# Patient Record
Sex: Female | Born: 1950 | Race: Black or African American | Hispanic: No | Smoking: Never smoker
Health system: Southern US, Community
[De-identification: ages and names within clinical notes are randomized; demographics above are authoritative.]

## PROBLEM LIST (undated history)

## (undated) DIAGNOSIS — C801 Malignant (primary) neoplasm, unspecified: Secondary | ICD-10-CM

## (undated) DIAGNOSIS — J45909 Unspecified asthma, uncomplicated: Secondary | ICD-10-CM

## (undated) DIAGNOSIS — K219 Gastro-esophageal reflux disease without esophagitis: Secondary | ICD-10-CM

## (undated) DIAGNOSIS — M419 Scoliosis, unspecified: Secondary | ICD-10-CM

## (undated) HISTORY — PX: REDUCTION MAMMAPLASTY: SUR839

---

## 1991-12-04 HISTORY — PX: ABDOMINAL HYSTERECTOMY: SHX81

## 1998-07-08 ENCOUNTER — Other Ambulatory Visit: Admission: RE | Admit: 1998-07-08 | Discharge: 1998-07-08 | Payer: Self-pay | Admitting: Gynecology

## 1999-07-18 ENCOUNTER — Other Ambulatory Visit: Admission: RE | Admit: 1999-07-18 | Discharge: 1999-07-18 | Payer: Self-pay | Admitting: Gynecology

## 2000-07-10 ENCOUNTER — Other Ambulatory Visit: Admission: RE | Admit: 2000-07-10 | Discharge: 2000-07-10 | Payer: Self-pay | Admitting: Gynecology

## 2001-07-15 ENCOUNTER — Other Ambulatory Visit: Admission: RE | Admit: 2001-07-15 | Discharge: 2001-07-15 | Payer: Self-pay | Admitting: Gynecology

## 2002-09-03 ENCOUNTER — Other Ambulatory Visit: Admission: RE | Admit: 2002-09-03 | Discharge: 2002-09-03 | Payer: Self-pay | Admitting: Gynecology

## 2003-09-23 ENCOUNTER — Other Ambulatory Visit: Admission: RE | Admit: 2003-09-23 | Discharge: 2003-09-23 | Payer: Self-pay | Admitting: Gynecology

## 2004-09-29 ENCOUNTER — Other Ambulatory Visit: Admission: RE | Admit: 2004-09-29 | Discharge: 2004-09-29 | Payer: Self-pay | Admitting: Gynecology

## 2005-10-02 ENCOUNTER — Other Ambulatory Visit: Admission: RE | Admit: 2005-10-02 | Discharge: 2005-10-02 | Payer: Self-pay | Admitting: Gynecology

## 2006-10-04 ENCOUNTER — Other Ambulatory Visit: Admission: RE | Admit: 2006-10-04 | Discharge: 2006-10-04 | Payer: Self-pay | Admitting: Gynecology

## 2007-10-23 ENCOUNTER — Other Ambulatory Visit: Admission: RE | Admit: 2007-10-23 | Discharge: 2007-10-23 | Payer: Self-pay | Admitting: Gynecology

## 2016-04-09 ENCOUNTER — Other Ambulatory Visit: Payer: Self-pay | Admitting: Gynecology

## 2016-04-09 DIAGNOSIS — R928 Other abnormal and inconclusive findings on diagnostic imaging of breast: Secondary | ICD-10-CM

## 2016-05-01 ENCOUNTER — Other Ambulatory Visit: Payer: Self-pay | Admitting: Gynecology

## 2016-05-01 ENCOUNTER — Ambulatory Visit
Admission: RE | Admit: 2016-05-01 | Discharge: 2016-05-01 | Disposition: A | Discharge status: Home / Self Care | Payer: BLUE CROSS/BLUE SHIELD | Source: Ambulatory Visit | Attending: Gynecology | Admitting: Gynecology

## 2016-05-01 DIAGNOSIS — R928 Other abnormal and inconclusive findings on diagnostic imaging of breast: Secondary | ICD-10-CM

## 2016-05-01 DIAGNOSIS — R921 Mammographic calcification found on diagnostic imaging of breast: Secondary | ICD-10-CM

## 2016-05-10 ENCOUNTER — Ambulatory Visit
Admission: RE | Admit: 2016-05-10 | Discharge: 2016-05-10 | Disposition: A | Discharge status: Home / Self Care | Payer: BLUE CROSS/BLUE SHIELD | Source: Ambulatory Visit | Attending: Gynecology | Admitting: Gynecology

## 2016-05-10 DIAGNOSIS — R921 Mammographic calcification found on diagnostic imaging of breast: Secondary | ICD-10-CM

## 2016-05-10 HISTORY — PX: BREAST BIOPSY: SHX20

## 2016-06-14 ENCOUNTER — Other Ambulatory Visit: Payer: Self-pay | Admitting: General Surgery

## 2016-06-14 DIAGNOSIS — C50911 Malignant neoplasm of unspecified site of right female breast: Secondary | ICD-10-CM

## 2016-06-19 ENCOUNTER — Ambulatory Visit
Admission: RE | Admit: 2016-06-19 | Discharge: 2016-06-19 | Disposition: A | Discharge status: Home / Self Care | Payer: BLUE CROSS/BLUE SHIELD | Source: Ambulatory Visit | Attending: General Surgery | Admitting: General Surgery

## 2016-06-19 DIAGNOSIS — C50911 Malignant neoplasm of unspecified site of right female breast: Secondary | ICD-10-CM

## 2016-06-19 MED ORDER — GADOBENATE DIMEGLUMINE 529 MG/ML IV SOLN
12.0000 mL | Freq: Once | INTRAVENOUS | Status: AC | PRN
  Administered 2016-06-19: 12 mL via INTRAVENOUS

## 2016-07-02 ENCOUNTER — Other Ambulatory Visit: Payer: Self-pay | Admitting: General Surgery

## 2016-07-02 DIAGNOSIS — D0511 Intraductal carcinoma in situ of right breast: Secondary | ICD-10-CM

## 2016-07-12 NOTE — H&P (Signed)
  Subjective:    Patient ID: Hayley Marks is a 65 y.o. female.  HPI  Referred by Dr. Donne Hazel for consultation for breast reconstruction. Presented following screening MMG with right breast calcifications 04/2016. Diagnostic MMG with calcifications LIQ right 3.8 x 3.3 x 2.6 cm. Biopsy with high grade DCIS, ER/PR+. MRI demonstrated enhancement over both LIQ, LOQ spanning 6.4 x 5.5 x 3.0 cm. Given extent of suspected disease mastectomy has been recommended. Has discussed NSM. Accompanied by boyfriend.  Reports doing a lot of reading and prefers no implants as part of reconstruction.   Current 36 C, happy with this. Wt stable.  Patient lives in [place], retired Therapist, sports.   Review of Systems  Allergic/Immunologic:       Reports allergy to UV light  Remainder 12 point review negative     Objective:   Physical Exam  Cardiovascular: Normal rate, regular rhythm and normal heart sounds.   Pulmonary/Chest: Effort normal and breath sounds normal.  Abdominal: Soft.  Umbilical scar, no hernia Redundant tissue abdomen  Musculoskeletal:  +scoliosis  Lymphadenopathy:    She has no axillary adenopathy.  Skin:  Fitzpatrick 5   No masses,    SN to nipple R 21 L 22   Assessment:     DCIS right breast    Plan:      Discussed immediate vs delayed, autologous vs implant based reconstruction. Reviewed incisions, drains, OR length, hospital stay and recovery for each. Discussed process of expansion and implant based risks including rupture, MRI surveillance for silicone implants, infection requiring surgery or removal, contracture. Discussed asymmetry one can expect with implant unilateral and natural breast on opposite. Discussed TRAM vs DIEP, latter would require treatment at tertiary center. Discussed abdominal based complications including failure flap, abdominal bulge or hernia. Discussed future surgery dependent on adjuvant treatments.  Reviewed SSM vs NSM, latter will be asensate and  not stimulate. Reviewed with both risks mastectomy flap necrosis requiring additional surgery.  I have counseled patient that if she desires NSM and abdominal based reconstruction, would prefer placement expander at time of mastectomy and not immediate flap. Explained that with preservation of soft tissue envelope with NSM, would not need abdominal skin and majority of this would be discarded. If any necrosis of mastectomy flaps, we would then have lost abdominal skin that could replace this. Discussed that she could elect for expander at time of mastectomy but could also choose to go to tertiary center for DIEP or other types of free tissue transfer (she mentioned buttock tissue) in future and still have benefit of preservation soft tissue envelope from NSM.  Reviewed portfolio, examples of expanders and implants.  Plan NSM with tissue expander placement.   Irene Limbo, MD [facility] Surgery [phone], pin [phone]

## 2016-07-27 ENCOUNTER — Encounter (HOSPITAL_BASED_OUTPATIENT_CLINIC_OR_DEPARTMENT_OTHER): Payer: Self-pay | Admitting: *Deleted

## 2016-08-02 ENCOUNTER — Ambulatory Visit (HOSPITAL_BASED_OUTPATIENT_CLINIC_OR_DEPARTMENT_OTHER)
Admission: RE | Admit: 2016-08-02 | Discharge: 2016-08-03 | Disposition: A | Discharge status: Home / Self Care | Payer: BLUE CROSS/BLUE SHIELD | Source: Ambulatory Visit | Attending: General Surgery | Admitting: General Surgery

## 2016-08-02 ENCOUNTER — Ambulatory Visit (HOSPITAL_BASED_OUTPATIENT_CLINIC_OR_DEPARTMENT_OTHER): Payer: BLUE CROSS/BLUE SHIELD | Admitting: Certified Registered"

## 2016-08-02 ENCOUNTER — Encounter (HOSPITAL_BASED_OUTPATIENT_CLINIC_OR_DEPARTMENT_OTHER)
Admission: RE | Disposition: A | Discharge status: Home / Self Care | Payer: Self-pay | Source: Ambulatory Visit | Attending: General Surgery

## 2016-08-02 ENCOUNTER — Encounter (HOSPITAL_BASED_OUTPATIENT_CLINIC_OR_DEPARTMENT_OTHER): Payer: Self-pay | Admitting: Certified Registered"

## 2016-08-02 ENCOUNTER — Encounter (HOSPITAL_COMMUNITY)
Admission: RE | Admit: 2016-08-02 | Discharge: 2016-08-02 | Disposition: A | Discharge status: Home / Self Care | Payer: BLUE CROSS/BLUE SHIELD | Source: Ambulatory Visit | Attending: General Surgery | Admitting: General Surgery

## 2016-08-02 DIAGNOSIS — C50311 Malignant neoplasm of lower-inner quadrant of right female breast: Secondary | ICD-10-CM | POA: Diagnosis present

## 2016-08-02 DIAGNOSIS — Z90710 Acquired absence of both cervix and uterus: Secondary | ICD-10-CM | POA: Insufficient documentation

## 2016-08-02 DIAGNOSIS — K219 Gastro-esophageal reflux disease without esophagitis: Secondary | ICD-10-CM | POA: Diagnosis not present

## 2016-08-02 DIAGNOSIS — Z803 Family history of malignant neoplasm of breast: Secondary | ICD-10-CM | POA: Diagnosis not present

## 2016-08-02 DIAGNOSIS — Z7982 Long term (current) use of aspirin: Secondary | ICD-10-CM | POA: Diagnosis not present

## 2016-08-02 DIAGNOSIS — D0511 Intraductal carcinoma in situ of right breast: Secondary | ICD-10-CM

## 2016-08-02 HISTORY — PX: NIPPLE SPARING MASTECTOMY/SENTINAL LYMPH NODE BIOPSY/RECONSTRUCTION/PLACEMENT OF TISSUE EXPANDER: SHX6484

## 2016-08-02 HISTORY — DX: Gastro-esophageal reflux disease without esophagitis: K21.9

## 2016-08-02 HISTORY — DX: Unspecified asthma, uncomplicated: J45.909

## 2016-08-02 HISTORY — DX: Scoliosis, unspecified: M41.9

## 2016-08-02 HISTORY — DX: Malignant (primary) neoplasm, unspecified: C80.1

## 2016-08-02 HISTORY — PX: MASTECTOMY: SHX3

## 2016-08-02 HISTORY — PX: BREAST RECONSTRUCTION WITH PLACEMENT OF TISSUE EXPANDER AND FLEX HD (ACELLULAR HYDRATED DERMIS): SHX6295

## 2016-08-02 SURGERY — NIPPLE SPARING MASTECTOMY WITH SENTINAL LYMPH NODE BIOPSY AND  RECONSTRUCTION WITH PLACEMENT OF TISSUE EXPANDER
Anesthesia: Regional | Site: Breast | Laterality: Right

## 2016-08-02 MED ORDER — CYCLOBENZAPRINE HCL 10 MG PO TABS
10.0000 mg | ORAL_TABLET | Freq: Three times a day (TID) | ORAL | Status: DC | PRN
Start: 2016-08-02 — End: 2016-08-03
  Administered 2016-08-02: 10 mg via ORAL
  Filled 2016-08-02: qty 1

## 2016-08-02 MED ORDER — SIMETHICONE 80 MG PO CHEW: 40.0000 mg | CHEWABLE_TABLET | Freq: Four times a day (QID) | ORAL | Status: DC | PRN

## 2016-08-02 MED ORDER — MIDAZOLAM HCL 2 MG/2ML IJ SOLN
1.0000 mg | INTRAMUSCULAR | Status: DC | PRN
  Administered 2016-08-02 (×2): 1 mg via INTRAVENOUS

## 2016-08-02 MED ORDER — CELECOXIB 200 MG PO CAPS
ORAL_CAPSULE | ORAL | Status: AC
  Filled 2016-08-02: qty 1

## 2016-08-02 MED ORDER — FENTANYL CITRATE (PF) 100 MCG/2ML IJ SOLN
50.0000 ug | INTRAMUSCULAR | Status: AC | PRN
  Administered 2016-08-02: 50 ug via INTRAVENOUS
  Administered 2016-08-02: 100 ug via INTRAVENOUS
  Administered 2016-08-02: 50 ug via INTRAVENOUS

## 2016-08-02 MED ORDER — ONDANSETRON HCL 4 MG/2ML IJ SOLN
INTRAMUSCULAR | Status: DC | PRN
  Administered 2016-08-02: 4 mg via INTRAVENOUS

## 2016-08-02 MED ORDER — ROCURONIUM BROMIDE 100 MG/10ML IV SOLN
INTRAVENOUS | Status: DC | PRN
  Administered 2016-08-02: 50 mg via INTRAVENOUS

## 2016-08-02 MED ORDER — DEXAMETHASONE SODIUM PHOSPHATE 10 MG/ML IJ SOLN
INTRAMUSCULAR | Status: AC
Start: 2016-08-02 — End: 2016-08-02
  Filled 2016-08-02: qty 1

## 2016-08-02 MED ORDER — FAMOTIDINE 20 MG PO TABS
20.0000 mg | ORAL_TABLET | Freq: Every day | ORAL | Status: DC
  Administered 2016-08-02: 20 mg via ORAL
  Filled 2016-08-02: qty 1

## 2016-08-02 MED ORDER — ONDANSETRON 4 MG PO TBDP: 4.0000 mg | ORAL_TABLET | Freq: Four times a day (QID) | ORAL | Status: DC | PRN

## 2016-08-02 MED ORDER — MIDAZOLAM HCL 2 MG/2ML IJ SOLN
INTRAMUSCULAR | Status: AC
  Filled 2016-08-02: qty 2

## 2016-08-02 MED ORDER — HYDROMORPHONE HCL 1 MG/ML IJ SOLN: 0.5000 mg | INTRAMUSCULAR | Status: DC | PRN

## 2016-08-02 MED ORDER — ACETAMINOPHEN 500 MG PO TABS
1000.0000 mg | ORAL_TABLET | ORAL | Status: AC
  Administered 2016-08-02: 1000 mg via ORAL

## 2016-08-02 MED ORDER — CEFAZOLIN SODIUM-DEXTROSE 2-4 GM/100ML-% IV SOLN
2.0000 g | INTRAVENOUS | Status: AC
  Administered 2016-08-02: 2 g via INTRAVENOUS

## 2016-08-02 MED ORDER — OXYCODONE HCL 5 MG PO TABS: 5.0000 mg | ORAL_TABLET | ORAL | 0 refills | Status: DC | PRN

## 2016-08-02 MED ORDER — HYDROMORPHONE HCL 1 MG/ML IJ SOLN
INTRAMUSCULAR | Status: AC
  Filled 2016-08-02: qty 1

## 2016-08-02 MED ORDER — ONDANSETRON HCL 4 MG/2ML IJ SOLN
INTRAMUSCULAR | Status: AC
  Filled 2016-08-02: qty 2

## 2016-08-02 MED ORDER — OXYCODONE HCL 5 MG/5ML PO SOLN: 5.0000 mg | Freq: Once | ORAL | Status: DC | PRN

## 2016-08-02 MED ORDER — PROPOFOL 10 MG/ML IV BOLUS
INTRAVENOUS | Status: AC
  Filled 2016-08-02: qty 20

## 2016-08-02 MED ORDER — SUGAMMADEX SODIUM 200 MG/2ML IV SOLN
INTRAVENOUS | Status: AC
  Filled 2016-08-02: qty 2

## 2016-08-02 MED ORDER — BUPIVACAINE-EPINEPHRINE (PF) 0.5% -1:200000 IJ SOLN
INTRAMUSCULAR | Status: DC | PRN
  Administered 2016-08-02: 25 mL via PERINEURAL

## 2016-08-02 MED ORDER — ACETAMINOPHEN 325 MG PO TABS: 650.0000 mg | ORAL_TABLET | Freq: Four times a day (QID) | ORAL | Status: DC | PRN

## 2016-08-02 MED ORDER — PROMETHAZINE HCL 12.5 MG RE SUPP: 12.5000 mg | Freq: Once | RECTAL | Status: DC

## 2016-08-02 MED ORDER — GLYCOPYRROLATE 0.2 MG/ML IJ SOLN: 0.2000 mg | Freq: Once | INTRAMUSCULAR | Status: DC | PRN

## 2016-08-02 MED ORDER — CEFAZOLIN IN D5W 1 GM/50ML IV SOLN
1.0000 g | Freq: Three times a day (TID) | INTRAVENOUS | Status: DC
  Administered 2016-08-02 – 2016-08-03 (×2): 1 g via INTRAVENOUS
  Filled 2016-08-02 (×2): qty 50

## 2016-08-02 MED ORDER — ACETAMINOPHEN 650 MG RE SUPP: 650.0000 mg | Freq: Four times a day (QID) | RECTAL | Status: DC | PRN

## 2016-08-02 MED ORDER — GABAPENTIN 300 MG PO CAPS
ORAL_CAPSULE | ORAL | Status: AC
  Filled 2016-08-02: qty 1

## 2016-08-02 MED ORDER — DEXAMETHASONE SODIUM PHOSPHATE 4 MG/ML IJ SOLN
INTRAMUSCULAR | Status: DC | PRN
  Administered 2016-08-02: 10 mg via INTRAVENOUS

## 2016-08-02 MED ORDER — LACTATED RINGERS IV SOLN
INTRAVENOUS | Status: DC
  Administered 2016-08-02 (×3): via INTRAVENOUS

## 2016-08-02 MED ORDER — ONDANSETRON HCL 4 MG/2ML IJ SOLN: 4.0000 mg | Freq: Once | INTRAMUSCULAR | Status: DC | PRN

## 2016-08-02 MED ORDER — POVIDONE-IODINE 10 % EX SOLN
CUTANEOUS | Status: DC | PRN
  Administered 2016-08-02: 1 via TOPICAL

## 2016-08-02 MED ORDER — SODIUM CHLORIDE 0.9 % IV SOLN
INTRAVENOUS | Status: DC | PRN
  Administered 2016-08-02: 12:00:00

## 2016-08-02 MED ORDER — LIDOCAINE 2% (20 MG/ML) 5 ML SYRINGE
INTRAMUSCULAR | Status: AC
  Filled 2016-08-02: qty 5

## 2016-08-02 MED ORDER — HYDROMORPHONE HCL 1 MG/ML IJ SOLN
0.2500 mg | INTRAMUSCULAR | Status: DC | PRN
  Administered 2016-08-02 (×3): 0.5 mg via INTRAVENOUS

## 2016-08-02 MED ORDER — CELECOXIB 200 MG PO CAPS
200.0000 mg | ORAL_CAPSULE | ORAL | Status: AC
  Administered 2016-08-02: 200 mg via ORAL

## 2016-08-02 MED ORDER — LIDOCAINE HCL (CARDIAC) 20 MG/ML IV SOLN
INTRAVENOUS | Status: DC | PRN
  Administered 2016-08-02: 80 mg via INTRAVENOUS

## 2016-08-02 MED ORDER — SODIUM CHLORIDE 0.9 % IV SOLN
INTRAVENOUS | Status: DC
  Administered 2016-08-02: 15:00:00 via INTRAVENOUS

## 2016-08-02 MED ORDER — ONDANSETRON HCL 4 MG/2ML IJ SOLN: 4.0000 mg | Freq: Four times a day (QID) | INTRAMUSCULAR | Status: DC | PRN

## 2016-08-02 MED ORDER — PROPOFOL 10 MG/ML IV BOLUS
INTRAVENOUS | Status: DC | PRN
  Administered 2016-08-02: 200 mg via INTRAVENOUS

## 2016-08-02 MED ORDER — ROCURONIUM BROMIDE 100 MG/10ML IV SOLN: INTRAVENOUS | Status: DC | PRN

## 2016-08-02 MED ORDER — CEFAZOLIN SODIUM-DEXTROSE 2-4 GM/100ML-% IV SOLN
INTRAVENOUS | Status: AC
  Filled 2016-08-02: qty 100

## 2016-08-02 MED ORDER — SUGAMMADEX SODIUM 200 MG/2ML IV SOLN
INTRAVENOUS | Status: DC | PRN
  Administered 2016-08-02: 140 mg via INTRAVENOUS

## 2016-08-02 MED ORDER — ZOLPIDEM TARTRATE 5 MG PO TABS: 5.0000 mg | ORAL_TABLET | Freq: Every evening | ORAL | Status: DC | PRN

## 2016-08-02 MED ORDER — ACETAMINOPHEN 500 MG PO TABS
ORAL_TABLET | ORAL | Status: AC
  Filled 2016-08-02: qty 2

## 2016-08-02 MED ORDER — MEPERIDINE HCL 25 MG/ML IJ SOLN: 6.2500 mg | INTRAMUSCULAR | Status: DC | PRN

## 2016-08-02 MED ORDER — GABAPENTIN 300 MG PO CAPS
300.0000 mg | ORAL_CAPSULE | ORAL | Status: AC
  Administered 2016-08-02: 300 mg via ORAL

## 2016-08-02 MED ORDER — OXYCODONE HCL 5 MG PO TABS
5.0000 mg | ORAL_TABLET | ORAL | Status: DC | PRN
  Administered 2016-08-02 – 2016-08-03 (×3): 10 mg via ORAL
  Filled 2016-08-02 (×3): qty 2

## 2016-08-02 MED ORDER — TECHNETIUM TC 99M SULFUR COLLOID FILTERED
1.0000 | Freq: Once | INTRAVENOUS | Status: AC | PRN
  Administered 2016-08-02: 1 via INTRADERMAL

## 2016-08-02 MED ORDER — SULFAMETHOXAZOLE-TRIMETHOPRIM 800-160 MG PO TABS: 1.0000 | ORAL_TABLET | Freq: Two times a day (BID) | ORAL | 0 refills | Status: DC

## 2016-08-02 MED ORDER — SCOPOLAMINE 1 MG/3DAYS TD PT72: 1.0000 | MEDICATED_PATCH | Freq: Once | TRANSDERMAL | Status: DC | PRN

## 2016-08-02 MED ORDER — FENTANYL CITRATE (PF) 100 MCG/2ML IJ SOLN
INTRAMUSCULAR | Status: AC
  Filled 2016-08-02: qty 2

## 2016-08-02 MED ORDER — OXYCODONE HCL 5 MG PO TABS: 5.0000 mg | ORAL_TABLET | Freq: Once | ORAL | Status: DC | PRN

## 2016-08-02 SURGICAL SUPPLY — 98 items
APL SKNCLS STERI-STRIP NONHPOA (GAUZE/BANDAGES/DRESSINGS)
APPLIER CLIP 11 MED OPEN (CLIP) ×3
APR CLP MED 11 20 MLT OPN (CLIP) ×1
BAG DECANTER FOR FLEXI CONT (MISCELLANEOUS) ×6 IMPLANT
BENZOIN TINCTURE PRP APPL 2/3 (GAUZE/BANDAGES/DRESSINGS) IMPLANT
BINDER BREAST LRG (GAUZE/BANDAGES/DRESSINGS) ×4 IMPLANT
BINDER BREAST MEDIUM (GAUZE/BANDAGES/DRESSINGS) IMPLANT
BINDER BREAST XLRG (GAUZE/BANDAGES/DRESSINGS) IMPLANT
BINDER BREAST XXLRG (GAUZE/BANDAGES/DRESSINGS) IMPLANT
BIOPATCH RED 1 DISK 7.0 (GAUZE/BANDAGES/DRESSINGS) ×2 IMPLANT
BIOPATCH RED 1IN DISK 7.0MM (GAUZE/BANDAGES/DRESSINGS) ×1
BLADE CLIPPER SURG (BLADE) IMPLANT
BLADE HEX COATED 2.75 (ELECTRODE) ×3 IMPLANT
BLADE SURG 10 STRL SS (BLADE) ×6 IMPLANT
BLADE SURG 15 STRL LF DISP TIS (BLADE) ×1 IMPLANT
BLADE SURG 15 STRL SS (BLADE) ×3
BNDG GAUZE ELAST 4 BULKY (GAUZE/BANDAGES/DRESSINGS) ×6 IMPLANT
CANISTER SUCT 1200ML W/VALVE (MISCELLANEOUS) ×6 IMPLANT
CHLORAPREP W/TINT 26ML (MISCELLANEOUS) ×6 IMPLANT
CLIP APPLIE 11 MED OPEN (CLIP) ×1 IMPLANT
CLIP TI WIDE RED SMALL 6 (CLIP) IMPLANT
CLOSURE WOUND 1/2 X4 (GAUZE/BANDAGES/DRESSINGS)
COVER BACK TABLE 60X90IN (DRAPES) ×3 IMPLANT
COVER MAYO STAND STRL (DRAPES) ×6 IMPLANT
COVER PROBE W GEL 5X96 (DRAPES) ×3 IMPLANT
DECANTER SPIKE VIAL GLASS SM (MISCELLANEOUS) IMPLANT
DEVICE DISSECT PLASMABLAD 3.0S (MISCELLANEOUS) IMPLANT
DRAIN CHANNEL 15F RND FF W/TCR (WOUND CARE) ×3 IMPLANT
DRAIN CHANNEL 19F RND (DRAIN) IMPLANT
DRAPE LAPAROSCOPIC ABDOMINAL (DRAPES) IMPLANT
DRAPE U-SHAPE 76X120 STRL (DRAPES) IMPLANT
DRAPE UTILITY XL STRL (DRAPES) ×3 IMPLANT
DRSG PAD ABDOMINAL 8X10 ST (GAUZE/BANDAGES/DRESSINGS) ×9 IMPLANT
DRSG TEGADERM 4X10 (GAUZE/BANDAGES/DRESSINGS) IMPLANT
DRSG TEGADERM 4X4.75 (GAUZE/BANDAGES/DRESSINGS) IMPLANT
ELECT BLADE 4.0 EZ CLEAN MEGAD (MISCELLANEOUS) ×3
ELECT BLADE 6.5 .24CM SHAFT (ELECTRODE) ×2 IMPLANT
ELECT COATED BLADE 2.86 ST (ELECTRODE) ×3 IMPLANT
ELECT REM PT RETURN 9FT ADLT (ELECTROSURGICAL) ×6
ELECTRODE BLDE 4.0 EZ CLN MEGD (MISCELLANEOUS) ×1 IMPLANT
ELECTRODE REM PT RTRN 9FT ADLT (ELECTROSURGICAL) ×2 IMPLANT
EVACUATOR SILICONE 100CC (DRAIN) ×6 IMPLANT
EXPANDER TISSUE MX 400CC (Breast) IMPLANT
GAUZE SPONGE 4X4 12PLY STRL (GAUZE/BANDAGES/DRESSINGS) ×3 IMPLANT
GLOVE BIO SURGEON STRL SZ 6 (GLOVE) ×6 IMPLANT
GLOVE BIO SURGEON STRL SZ 6.5 (GLOVE) IMPLANT
GLOVE BIO SURGEON STRL SZ7 (GLOVE) ×3 IMPLANT
GLOVE BIO SURGEONS STRL SZ 6.5 (GLOVE)
GLOVE BIOGEL PI IND STRL 7.5 (GLOVE) ×1 IMPLANT
GLOVE BIOGEL PI INDICATOR 7.5 (GLOVE) ×2
GOWN STRL REUS W/ TWL LRG LVL3 (GOWN DISPOSABLE) ×5 IMPLANT
GOWN STRL REUS W/TWL LRG LVL3 (GOWN DISPOSABLE) ×15
ILLUMINATOR WAVEGUIDE N/F (MISCELLANEOUS) IMPLANT
IV NS 500ML (IV SOLUTION) ×6
IV NS 500ML BAXH (IV SOLUTION) ×2 IMPLANT
KIT FILL SYSTEM UNIVERSAL (SET/KITS/TRAYS/PACK) ×4 IMPLANT
KIT MARKER MARGIN INK (KITS) IMPLANT
LIGHT WAVEGUIDE WIDE FLAT (MISCELLANEOUS) IMPLANT
LIQUID BAND (GAUZE/BANDAGES/DRESSINGS) ×9 IMPLANT
NDL HYPO 25X1 1.5 SAFETY (NEEDLE) IMPLANT
NDL SAFETY ECLIPSE 18X1.5 (NEEDLE) IMPLANT
NEEDLE HYPO 18GX1.5 SHARP (NEEDLE)
NEEDLE HYPO 25X1 1.5 SAFETY (NEEDLE) IMPLANT
NS IRRIG 1000ML POUR BTL (IV SOLUTION) ×3 IMPLANT
PACK BASIN DAY SURGERY FS (CUSTOM PROCEDURE TRAY) ×6 IMPLANT
PACK UNIVERSAL I (CUSTOM PROCEDURE TRAY) ×3 IMPLANT
PENCIL BUTTON HOLSTER BLD 10FT (ELECTRODE) ×6 IMPLANT
PIN SAFETY STERILE (MISCELLANEOUS) ×3 IMPLANT
PLASMABLADE 3.0S (MISCELLANEOUS)
SHEET MEDIUM DRAPE 40X70 STRL (DRAPES) IMPLANT
SLEEVE SCD COMPRESS KNEE MED (MISCELLANEOUS) ×6 IMPLANT
SPONGE LAP 18X18 X RAY DECT (DISPOSABLE) ×12 IMPLANT
STAPLER VISISTAT 35W (STAPLE) IMPLANT
STRIP CLOSURE SKIN 1/2X4 (GAUZE/BANDAGES/DRESSINGS) IMPLANT
SUT ETHIBOND 2-0 V-5 NDL (SUTURE) IMPLANT
SUT ETHIBOND 2-0 V-5 NEEDLE (SUTURE) IMPLANT
SUT ETHILON 2 0 FS 18 (SUTURE) IMPLANT
SUT ETHILON 3 0 PS 1 (SUTURE) IMPLANT
SUT MNCRL AB 3-0 PS2 18 (SUTURE) IMPLANT
SUT MNCRL AB 4-0 PS2 18 (SUTURE) ×6 IMPLANT
SUT MON AB 5-0 PS2 18 (SUTURE) IMPLANT
SUT PDS AB 2-0 CT2 27 (SUTURE) IMPLANT
SUT SILK 2 0 SH (SUTURE) IMPLANT
SUT SILK 3 0 PS 1 (SUTURE) IMPLANT
SUT VIC AB 3-0 SH 27 (SUTURE) ×3
SUT VIC AB 3-0 SH 27X BRD (SUTURE) ×1 IMPLANT
SUT VICRYL 4-0 PS2 18IN ABS (SUTURE) ×3 IMPLANT
SYR 50ML LL SCALE MARK (SYRINGE) IMPLANT
SYR BULB IRRIGATION 50ML (SYRINGE) ×9 IMPLANT
SYR CONTROL 10ML LL (SYRINGE) IMPLANT
TAPE MEASURE VINYL STERILE (MISCELLANEOUS) ×3 IMPLANT
TISSUE EXPANDER MX 400CC (Breast) ×3 IMPLANT
TOWEL OR 17X24 6PK STRL BLUE (TOWEL DISPOSABLE) ×12 IMPLANT
TRAY FOLEY CATH SILVER 16FR (SET/KITS/TRAYS/PACK) ×2 IMPLANT
TUBE CONNECTING 20'X1/4 (TUBING) ×2
TUBE CONNECTING 20X1/4 (TUBING) ×4 IMPLANT
UNDERPAD 30X30 (UNDERPADS AND DIAPERS) ×9 IMPLANT
YANKAUER SUCT BULB TIP NO VENT (SUCTIONS) ×6 IMPLANT

## 2016-08-02 NOTE — Op Note (Signed)
Operative Note   DATE OF OPERATION: 8.31.17  LOCATION: [facility]-observation  SURGICAL DIVISION: Plastic Surgery  PREOPERATIVE DIAGNOSES:  Right breast DCIS  POSTOPERATIVE DIAGNOSES:  same  PROCEDURE:  1. Right breast reconstruction with tissue expander  SURGEON: Irene Limbo MD MBA  ASSISTANT: none  ANESTHESIA:  General.   EBL: 100 ml for entire case  COMPLICATIONS: None immediate.   INDICATIONS FOR PROCEDURE:  The patient, Hayley Marks, is a 65 y.o. female born on 04/20/1951, is here for immediate expander based reconstruction following nipple sparing mastectomy.   FINDINGS:Natrelle 133MX-12-T 400 ml expander placed, initial fill volume 150 ml. SN [id number]  DESCRIPTION OF PROCEDURE:  The patient's operative site was marked with the patient in the preoperative area including sternal notch, chest midline, anterior axillary lines. The patient was taken to the operating room. SCDs were placed and IV antibiotics were given. Foley catheter placed.The patient's operative site was prepped and draped in a sterile fashion. A time out was performed and all information was confirmed to be correct. Following completion of mastectomy, reconstruction began on right side. The pectoralis major muscle was elevated continuous with abdominal wall fascia. The serratus fascia and muscle were elevated laterally. A 19 Fr drain was placed in subcutaneous position laterally and submuscular position medialy and secured to skin with 2-0 nylon. The cavity was irrigated with solution containing Ancef, genatmicin, and bacitracin. Hemostasis was ensured. Cavity irrigated with Betadine. The tissue expander was prepared and placed in submuscular position. The expander was secured to chest wall with a 3-0 vicryl. The pectoralis was secured to elevated serratus fascia and muscle with interrupted figure of eight 3-0 vicryl. The incision was closed with 3-0 vicryl in fascial layer and 4-0 vicryl in dermis.  Skin closure completed with 4-0 monocryl subcuticular and tissue adhesive. Patient was brought to sitting position and the skin flap was redraped so that NAC was symmetric from midline. Transparent, adherent dressings applied. Dry dressing and breast binder applied.  The patient was allowed to wake from anesthesia, extubated and taken to the recovery room in satisfactory condition.   SPECIMENS: none  DRAINS: 19 Fr JP in right breast reconstruction  Irene Limbo, MD [facility]astic & Reconstructive Surgery [phone], pin [phone]

## 2016-08-02 NOTE — Anesthesia Procedure Notes (Addendum)
Anesthesia Regional Block:  Pectoralis block  Pre-Anesthetic Checklist: ,, timeout performed, Correct Patient, Correct Site, Correct Laterality, Correct Procedure, Correct Position, site marked, Risks and benefits discussed,  Surgical consent,  Pre-op evaluation,  At surgeon's request and post-op pain management  Laterality: Right and Upper  Prep: chloraprep       Needles:  Injection technique: Single-shot  Needle Type: Echogenic Needle     Needle Length: 9cm 9 cm Needle Gauge: 21 and 21 G    Additional Needles:  Procedures: ultrasound guided (picture in chart) Pectoralis block Narrative:  Start time: 08/02/2016 10:08 AM End time: 08/02/2016 10:13 AM Injection made incrementally with aspirations every 5 mL.  Performed by: Personally  Anesthesiologist: Kennice Finnie       Right Pectoralis block image

## 2016-08-02 NOTE — Interval H&P Note (Signed)
History and Physical Interval Note:  08/02/2016 9:11 AM  Hayley Marks  has presented today for surgery, with the diagnosis of RIGHT BREAST DCIS  The various methods of treatment have been discussed with the patient and family. After consideration of risks, benefits and other options for treatment, the patient has consented to  Procedure(s): RIGHT NIPPLE SPARING MASTECTOMY WITH SENTINAL LYMPH NODE BIOPSY (Right) RIGHT BREAST RECONSTRUCTION WITH PLACEMENT OF TISSUE EXPANDER AND FLEX HD (ACELLULAR HYDRATED DERMIS) POSSIBLE PLACEMENT OF ALLODERM (Right) as a surgical intervention .  The patient's history has been reviewed, patient examined, no change in status, stable for surgery.  I have reviewed the patient's chart and labs.  Questions were answered to the patient's satisfaction.     [name]

## 2016-08-02 NOTE — Discharge Instructions (Signed)

## 2016-08-02 NOTE — Progress Notes (Signed)
Assisted Dr. Al Corpus with right, ultrasound guided, pectoralis block and nuc med inj with nuc med tech # [phone]. Side rails up, monitors on throughout procedure. See vital signs in flow sheet. Tolerated Procedure well.

## 2016-08-02 NOTE — H&P (Signed)
65 yof who presents after screening mm that showed moderate area of calcifications. she is seen in consult from Dr Jetta Lout her density is b. she has no prior breast history. has a fh in her maternal aunt only. she had no mass or discharge. she has area of indeterminate calcs in the medial right breast measuring 3.8x3.3x2.6 cm in size. this area has undergone core biopsy showing hg dcis. Mri shows larger area. the tissue marker is 18 mm from the biopsy site.   Other Problems Back Pain Gastroesophageal Reflux Disease Lump In Breast  Past Surgical History  Breast Biopsy Right. Hysterectomy (not due to cancer) - Partial  Diagnostic Studies History Colonoscopy 5-10 years ago Mammogram within last year Pap Smear >5 years ago  Allergies No Known Drug Allergies  Medication History  Cyclobenzaprine HCl (10MG  Tablet, Oral) Active. Zantac (150MG  Tablet, Oral) Active. Multiple Vitamin (Oral) Active. Aspirin (81MG  Tablet, Oral) Active. Medications Reconciled  Social History  Alcohol use Occasional alcohol use. Caffeine use Coffee, Tea. No drug use Tobacco use Never smoker.  Family History  Alcohol Abuse Father. Arthritis Father. Breast Cancer Family Members In General. Colon Cancer Family Members In General. Hypertension Father, Mother, Son. Malignant Neoplasm Of Pancreas Family Members In General. Thyroid problems Mother.  Pregnancy / Birth History  Age at menarche 65 years. Age of menopause 51-55 Contraceptive History Oral contraceptives. Gravida 4 Length (months) of breastfeeding 3-6 Maternal age 35-25 Para 2  Review of Systems General Not Present- Appetite Loss, Chills, Fatigue, Fever, Night Sweats, Weight Gain and Weight Loss. Skin Not Present- Change in Wart/Mole, Dryness, Hives, Jaundice, New Lesions, Non-Healing Wounds, Rash and Ulcer. HEENT Present- Wears glasses/contact lenses. Not Present- Earache, Hearing Loss, Hoarseness,  Nose Bleed, Oral Ulcers, Ringing in the Ears, Seasonal Allergies, Sinus Pain, Sore Throat, Visual Disturbances and Yellow Eyes. Breast Present- Breast Mass. Not Present- Breast Pain, Nipple Discharge and Skin Changes. Cardiovascular Not Present- Chest Pain, Difficulty Breathing Lying Down, Leg Cramps, Palpitations, Rapid Heart Rate, Shortness of Breath and Swelling of Extremities. Gastrointestinal Not Present- Abdominal Pain, Bloating, Bloody Stool, Change in Bowel Habits, Chronic diarrhea, Constipation, Difficulty Swallowing, Excessive gas, Gets full quickly at meals, Hemorrhoids, Indigestion, Nausea, Rectal Pain and Vomiting. Female Genitourinary Not Present- Frequency, Nocturia, Painful Urination, Pelvic Pain and Urgency. Musculoskeletal Not Present- Back Pain, Joint Pain, Joint Stiffness, Muscle Pain, Muscle Weakness and Swelling of Extremities. Neurological Not Present- Decreased Memory, Fainting, Headaches, Numbness, Seizures, Tingling, Tremor, Trouble walking and Weakness. Psychiatric Not Present- Anxiety, Bipolar, Change in Sleep Pattern, Depression, Fearful and Frequent crying. Endocrine Not Present- Cold Intolerance, Excessive Hunger, Hair Changes, Heat Intolerance, Hot flashes and New Diabetes. Hematology Not Present- Blood Thinners, Easy Bruising, Excessive bleeding, Gland problems, HIV and Persistent Infections.  Vitals  Weight: 142 lb Height: 59.5in Body Surface Area: 1.6 m Body Mass Index: 28.2 kg/m  Temp.: 97.2F(Temporal)  Pulse: 73 (Regular)  BP: 124/74 (Sitting, Left Arm, Standard)   Physical Exam  General Mental Status-Alert. Orientation-Oriented X3.  Eye Sclera/Conjunctiva - Bilateral-No scleral icterus.  Chest and Lung Exam Chest and lung exam reveals -on auscultation, normal breath sounds, no adventitious sounds and normal vocal resonance.  Breast Nipples-No Discharge. Breast Lump-No Palpable Breast  Mass.  Cardiovascular Cardiovascular examination reveals -normal heart sounds, regular rate and rhythm with no murmurs.  Lymphatic Head & Neck  General Head & Neck Lymphatics: Bilateral - Description - Normal. Axillary  General Axillary Region: Bilateral - Description - Normal. Note: no [place] adenopathy     Assessment &  Plan BREAST CANCER OF LOWER-INNER QUADRANT OF RIGHT FEMALE BREAST (C50.311) Right nsm with right axillary sn biopsy, expander reconstruction

## 2016-08-02 NOTE — Transfer of Care (Signed)
Immediate Anesthesia Transfer of Care Note  Patient: Hayley Marks  Procedure(s) Performed: Procedure(s): RIGHT NIPPLE SPARING MASTECTOMY WITH SENTINAL LYMPH NODE BIOPSY (Right) RIGHT BREAST RECONSTRUCTION WITH PLACEMENT OF TISSUE EXPANDER AND FLEX HD (ACELLULAR HYDRATED DERMIS) POSSIBLE PLACEMENT OF ALLODERM (Right)  Patient Location: PACU  Anesthesia Type:GA combined with regional for post-op pain  Level of Consciousness: awake, alert  and patient cooperative  Airway & Oxygen Therapy: Patient Spontanous Breathing and Patient connected to face mask oxygen  Post-op Assessment: Report given to RN, Post -op Vital signs reviewed and stable and Patient moving all extremities  Post vital signs: Reviewed and stable  Last Vitals:  Vitals:   08/02/16 1017 08/02/16 1018  BP: 122/73   Pulse: 80 87  Resp: 16 (!) 21  Temp:      Last Pain:  Vitals:   08/02/16 0913  TempSrc: Oral         Complications: No apparent anesthesia complications

## 2016-08-02 NOTE — Anesthesia Postprocedure Evaluation (Signed)
Anesthesia Post Note  Patient: Hayley Marks  Procedure(s) Performed: Procedure(s) (LRB): RIGHT NIPPLE SPARING MASTECTOMY WITH SENTINAL LYMPH NODE BIOPSY (Right) RIGHT BREAST RECONSTRUCTION WITH PLACEMENT OF TISSUE EXPANDER (Right)  Patient location during evaluation: PACU Anesthesia Type: General Level of consciousness: awake and alert Pain management: pain level controlled Vital Signs Assessment: post-procedure vital signs reviewed and stable Respiratory status: spontaneous breathing, nonlabored ventilation and respiratory function stable Cardiovascular status: blood pressure returned to baseline and stable Postop Assessment: no signs of nausea or vomiting Anesthetic complications: no    Last Vitals:  Vitals:   08/02/16 1400 08/02/16 1415  BP: (!) 128/91 (!) 142/97  Pulse:  78  Resp: 13 14  Temp:      Last Pain:  Vitals:   08/02/16 1430  TempSrc:   PainSc: Asleep                 Paden Kuras A

## 2016-08-02 NOTE — Op Note (Signed)
Preoperative diagnosis: rightbreast dcis Postoperative diagnosis: same as above Procedure: Rightnipple sparing mastectomy, rightaxillary sn biopsy Surgeon: Dr Serita Grammes Asst: Dr Irene Limbo Anesthesia: general with pectoral block EBL: minimal Drains per plastic surgery Specimens: right breast marked short superior long lateral and double deep, right nipple margin, right axillary sn biopsy with highest count of [id number] Complications none Sponge and needle count correct dispo case turned over to plastics  Indications: This is a 5 yof with extensive right breast dcis. We discussed options and have elected for right nsm with right ax sn biopsy followed by expander placement.    Procedure: After informed consent was obtained the patient was taken to the OR. She was given antibiotics and scds were in place. She had a pectoral block. She was placed under general anesthesia without complication. She was prepped and draped in the standard sterile surgical fashion. A timeout was performed.  I made a 12cm inframammary incision about 9cm from sternum on the right side. I then created a posterior flap with cautery removing the fascia from the pectoralis muscle. This was done to the parasternal region, clavicle and latissimus laterally. I then created the anterior flap. The breast was then removed in its entirety. The skin and nac were all viable. The breast was marked as above. I removed the nipple margin as a separate specimen.I used the neoprobe to identify the axillary sentinel nodes. The highest count was as above. There was no real background radioactivity. Hemostasis was obtained. Case was turned over to plastic surgery for completion.

## 2016-08-02 NOTE — Interval H&P Note (Signed)
History and Physical Interval Note:  08/02/2016 9:53 AM  Hayley Marks  has presented today for surgery, with the diagnosis of RIGHT BREAST DCIS  The various methods of treatment have been discussed with the patient and family. After consideration of risks, benefits and other options for treatment, the patient has consented to  Procedure(s): RIGHT NIPPLE SPARING MASTECTOMY WITH SENTINAL LYMPH NODE BIOPSY (Right) RIGHT BREAST RECONSTRUCTION WITH PLACEMENT OF TISSUE EXPANDER AND FLEX HD (ACELLULAR HYDRATED DERMIS) POSSIBLE PLACEMENT OF ALLODERM (Right) as a surgical intervention .  The patient's history has been reviewed, patient examined, no change in status, stable for surgery.  I have reviewed the patient's chart and labs.  Questions were answered to the patient's satisfaction.     Meena Barrantes

## 2016-08-02 NOTE — Anesthesia Procedure Notes (Signed)
Procedure Name: Intubation Date/Time: 08/02/2016 10:39 AM Performed by: Baxter Flattery Pre-anesthesia Checklist: Patient identified, Emergency Drugs available, Suction available and Patient being monitored Patient Re-evaluated:Patient Re-evaluated prior to inductionOxygen Delivery Method: Circle system utilized Preoxygenation: Pre-oxygenation with 100% oxygen Intubation Type: IV induction Ventilation: Mask ventilation without difficulty Laryngoscope Size: Miller and 2 Grade View: Grade I Tube type: Oral Tube size: 7.0 mm Number of attempts: 1 Airway Equipment and Method: Stylet and Bite block Placement Confirmation: ETT inserted through vocal cords under direct vision,  positive ETCO2 and breath sounds checked- equal and bilateral Secured at: 21 cm Tube secured with: Tape Dental Injury: Teeth and Oropharynx as per pre-operative assessment

## 2016-08-02 NOTE — Anesthesia Preprocedure Evaluation (Signed)
Anesthesia Evaluation  Patient identified by MRN, date of birth, ID band Patient awake    Reviewed: Allergy & Precautions, NPO status , Patient's Chart, lab work & pertinent test results  Airway Mallampati: I  TM Distance: >3 FB Neck ROM: Full    Dental  (+) Teeth Intact, Dental Advisory Given   Pulmonary    breath sounds clear to auscultation       Cardiovascular  Rhythm:Regular Rate:Normal     Neuro/Psych    GI/Hepatic GERD  Medicated and Controlled,  Endo/Other    Renal/GU      Musculoskeletal   Abdominal   Peds  Hematology   Anesthesia Other Findings   Reproductive/Obstetrics                             Anesthesia Physical Anesthesia Plan  ASA: II  Anesthesia Plan: General   Post-op Pain Management: GA combined w/ Regional for post-op pain   Induction: Intravenous  Airway Management Planned: LMA  Additional Equipment:   Intra-op Plan:   Post-operative Plan: Extubation in OR  Informed Consent: I have reviewed the patients History and Physical, chart, labs and discussed the procedure including the risks, benefits and alternatives for the proposed anesthesia with the patient or authorized representative who has indicated his/her understanding and acceptance.   Dental advisory given  Plan Discussed with: CRNA, Anesthesiologist and Surgeon  Anesthesia Plan Comments:         Anesthesia Quick Evaluation

## 2016-08-03 ENCOUNTER — Encounter (HOSPITAL_BASED_OUTPATIENT_CLINIC_OR_DEPARTMENT_OTHER): Payer: Self-pay | Admitting: General Surgery

## 2016-08-03 DIAGNOSIS — C50311 Malignant neoplasm of lower-inner quadrant of right female breast: Secondary | ICD-10-CM | POA: Diagnosis not present

## 2016-08-03 NOTE — Progress Notes (Signed)
1 Day Post-Op  Subjective: Doing well, no complaints  Objective: Vital signs in last 24 hours: Temp:  [96.9 F (36.1 C)-97.8 F (36.6 C)] 97.5 F (36.4 C) (09/01 0626) Pulse Rate:  [62-102] 102 (09/01 0650) Resp:  [10-24] 16 (09/01 0626) BP: (92-144)/(49-97) 105/73 (09/01 0650) SpO2:  [94 %-100 %] 100 % (09/01 0626) Weight:  [63.5 kg (140 lb)] 63.5 kg (140 lb) (08/31 0913)    Intake/Output from previous day: 08/31 0701 - 09/01 0700 In: 2969.8 [P.O.:664; I.V.:2255.8; IV Piggyback:50] Out: [id number] [Urine:2950; Drains:385; Blood:100] Intake/Output this shift: No intake/output data recorded.    Studies/Results: Nm Sentinel Node Inj-no Rpt (breast)  Result Date: 08/02/2016 CLINICAL DATA: right axillary sn biopsy Sulfur colloid was injected intradermally by the nuclear medicine technologist for breast cancer sentinel node localization.    Anti-infectives: Anti-infectives    Start     Dose/Rate Route Frequency Ordered Stop   08/02/16 1500  ceFAZolin (ANCEF) IVPB 1 g/50 mL premix     1 g 100 mL/hr over 30 Minutes Intravenous Every 8 hours 08/02/16 1453 08/03/16 1359   08/02/16 1146  bacitracin 50,000 Units, gentamicin (GARAMYCIN) 80 mg, ceFAZolin (ANCEF) 1 g in sodium chloride 0.9 % 1,000 mL  Status:  Discontinued       As needed 08/02/16 1147 08/02/16 1254   08/02/16 0940  ceFAZolin (ANCEF) IVPB 2g/100 mL premix     2 g 200 mL/hr over 30 Minutes Intravenous On call to O.R. 08/02/16 0940 08/02/16 1056   08/02/16 0000  sulfamethoxazole-trimethoprim (BACTRIM DS,SEPTRA DS) 800-160 MG tablet     1 tablet Oral 2 times daily 08/02/16 1245        Assessment/Plan: POD 1 right nsm with right ax sn biopsy  Dc home today Path pending  [facility] 08/03/2016

## 2016-08-20 ENCOUNTER — Telehealth: Payer: Self-pay | Admitting: Hematology

## 2016-08-20 NOTE — Telephone Encounter (Signed)
Telephone call was made to the pt on 9/15 to schedule an appt. Because she [place] 100 miles away, her driver can only bring her to the office on Thursdays. Appt was scheduled for 9/21 with dr. Burr Medico. She agreed. Demographics verified. Letter mailed to the patient.

## 2016-08-22 NOTE — Progress Notes (Signed)
[place]  [phone] Fax:(336) [phone]  Clinic New Consult Note   No care team member to display 08/23/2016  Referring physician: Dr. Donne Hazel  CHIEF COMPLAINTS/PURPOSE OF CONSULTATION:  Newly diagnosed right breast cancer  Oncology History   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 female breast [facility])   Staging form: Breast, AJCC 7th Edition   - Clinical stage from 05/10/2016: Stage 0 (Tis (DCIS), N0, cM0(i+)) - Signed by Truitt Merle, MD on 08/22/2016   - Pathologic stage from 08/02/2016: Stage IA (T1b, N0, cM0) - Signed by Truitt Merle, MD on 08/22/2016      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 female breast ([place])   05/01/2016 Mammogram    Diagnostic mammo showed a group microcalcification in the medial lower right breast measuring 3.8X3.3X2.6cm       05/10/2016 Initial Diagnosis    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 female breast ([place])      05/10/2016 Initial Biopsy    Right breast biopsy showed DCIS      05/10/2016 Receptors her2    ER 95%+, PR 5%+      06/19/2016 Imaging    Breast MRI showed extensive non-mass enhancement involving the entire lower right breast, 6.4X5.5X3.0cm, no adenopathy, left breast (-)       08/02/2016 Surgery    Right mastectomy and SLN biopsy       08/02/2016 Pathology Results    Right mastectomy showed invasive ductal carcinoma, G1, and high grade DCIS, DCIS was focally positive at posterior margin and broadly less than 0.1cm from the same posterior margin, 1 SLN was negative, nipple biopsy was negative       08/02/2016 Receptors her2    ER 100% strongly +, PR-, HER2-, Ki67 5%        HISTORY OF PRESENTING ILLNESS:  Hayley Marks 65 y.o. female is here because of her right breast cancer. She presents to the clinic by herself today.  This was discovered by screening mammogram. She never had abnormal mammogram or biopsy before. She underwent diagnostic mammogram and ultrasound on 05/01/2016,  which showed a group of calcification in the right breast, measuring 3.8 cm. Core needle biopsy showed DCIS. ER/PR positive. She underwent breast MRI on 06/19/2016, which showed extensive non-mass enhancement involving the entire lower right breast, measuring 6.4 cm. Dr. Donne Hazel recommended right mastectomy and sentinel lymph node biopsy, which was done on 08/02/2016. Final surgical path showed small focus of invasive ductal carcinoma, and high-grade DCIS.   She has been recovering well from surgery, pain is mild, she takes Tylenol as needed. No other complaints.  GYN HISTORY  Menarchal: 14 LMP: hysrectomy in 1993 Contraceptive: no  HRT: no  G4P2:   MEDICAL HISTORY:  Past Medical History:  Diagnosis Date  . Asthma    as child  . Cancer ([place])    DCIS right breast  . GERD (gastroesophageal reflux disease)   . Scoliosis     SURGICAL HISTORY: Past Surgical History:  Procedure Laterality Date  . ABDOMINAL HYSTERECTOMY  1993  . BREAST RECONSTRUCTION WITH PLACEMENT OF TISSUE EXPANDER AND FLEX HD (ACELLULAR HYDRATED DERMIS) Right 08/02/2016   Procedure: RIGHT BREAST RECONSTRUCTION WITH PLACEMENT OF TISSUE EXPANDER;  Surgeon: Irene Limbo, MD;  Location: [place];  Service: Plastics;  Laterality: Right;  . NIPPLE SPARING MASTECTOMY/SENTINAL LYMPH NODE BIOPSY/RECONSTRUCTION/PLACEMENT OF TISSUE EXPANDER Right 08/02/2016   Procedure: RIGHT NIPPLE SPARING MASTECTOMY WITH SENTINAL LYMPH NODE BIOPSY;  Surgeon: Rolm Bookbinder, MD;  Location: [place];  Service: General;  Laterality: Right;    SOCIAL HISTORY: Social History   Social History  . Marital status: Married    Spouse name: N/A  . Number of children: N/A  . Years of education: N/A   Occupational History  . Not on file.   Social History Main Topics  . Smoking status: Never Smoker  . Smokeless tobacco: Never Used  . Alcohol use Yes     Comment: social  . Drug use: No  . Sexual activity:  Not on file   Other Topics Concern  . Not on file   Social History Narrative  . No narrative on file   She is divorced, she has 2 sons, 96 and 70 yo   She is a retired Marine scientist, she used to Hotel manager in college.   FAMILY HISTORY: Family History  Problem Relation Age of Onset  . Cancer Maternal Aunt 49    breast cancer   . Cancer Maternal Grandmother     colon cancer  . Cancer Maternal Grandfather     gastric and pancreatic cancer     ALLERGIES:  is allergic to penicillins and other.  MEDICATIONS:  Current Outpatient Prescriptions  Medication Sig Dispense Refill  . aspirin EC 81 MG tablet Take 81 mg by mouth daily.    . cyclobenzaprine (FLEXERIL) 10 MG tablet Take 10 mg by mouth 3 (three) times daily as needed for muscle spasms.    . Multiple Vitamin (MULTIVITAMIN WITH MINERALS) TABS tablet Take 1 tablet by mouth daily.    Marland Kitchen oxyCODONE (ROXICODONE) 5 MG immediate release tablet Take 1 tablet (5 mg total) by mouth every 4 (four) hours as needed for severe pain. 50 tablet 0  . ranitidine (ZANTAC) 150 MG tablet Take 150 mg by mouth 2 (two) times daily.    Marland Kitchen letrozole (FEMARA) 2.5 MG tablet Take 1 tablet (2.5 mg total) by mouth daily. 30 tablet 2   No current facility-administered medications for this visit.     REVIEW OF SYSTEMS:   Constitutional: Denies fevers, chills or abnormal night sweats Eyes: Denies blurriness of vision, double vision or watery eyes Ears, nose, mouth, throat, and face: Denies mucositis or sore throat Respiratory: Denies cough, dyspnea or wheezes Cardiovascular: Denies palpitation, chest discomfort or lower extremity swelling Gastrointestinal:  Denies nausea, heartburn or change in bowel habits Skin: Denies abnormal skin rashes Lymphatics: Denies new lymphadenopathy or easy bruising Neurological:Denies numbness, tingling or new weaknesses Behavioral/Psych: Mood is stable, no new changes  All other systems were reviewed with the patient and are  negative.  PHYSICAL EXAMINATION: ECOG PERFORMANCE STATUS: 0 - Asymptomatic  Vitals:   08/23/16 1455  BP: 131/72  Pulse: 88  Resp: 18  Temp: 98.2 F (36.8 C)   Filed Weights   08/23/16 1455  Weight: 141 lb 11.2 oz (64.3 kg)    GENERAL:alert, no distress and comfortable SKIN: skin color, texture, turgor are normal, no rashes or significant lesions EYES: normal, conjunctiva are pink and non-injected, sclera clear OROPHARYNX:no exudate, no erythema and lips, buccal mucosa, and tongue normal  NECK: supple, thyroid normal size, non-tender, without nodularity LYMPH:  no palpable lymphadenopathy in the cervical, axillary or inguinal LUNGS: clear to auscultation and percussion with normal breathing effort HEART: regular rate & rhythm and no murmurs and no lower extremity edema ABDOMEN:abdomen soft, non-tender and normal bowel sounds Musculoskeletal:no cyanosis of digits and no clubbing  PSYCH: alert & oriented x 3 with fluent speech NEURO:  no focal motor/sensory deficits Breasts: Breast inspection showed right breast is surgically absent, incision is healing well. No skin erythema or discharge. Palpation of the left breasts and axilla revealed no obvious mass that I could appreciate.   LABORATORY DATA:  I have reviewed the data as listed CBC Latest Ref Rng & Units 08/23/2016  WBC 3.9 - 10.3 10e3/uL 5.4  Hemoglobin 11.6 - 15.9 g/dL 12.4  Hematocrit 34.8 - 46.6 % 36.7  Platelets 145 - 400 10e3/uL 311   CMP Latest Ref Rng & Units 08/23/2016  Glucose 70 - 140 mg/dl 82  BUN 7.0 - 26.0 mg/dL 13.5  Creatinine 0.6 - 1.1 mg/dL 1.0  Sodium 136 - 145 mEq/L 142  Potassium 3.5 - 5.1 mEq/L 3.9  CO2 22 - 29 mEq/L 25  Calcium 8.4 - 10.4 mg/dL 9.6  Total Protein 6.4 - 8.3 g/dL 8.0  Total Bilirubin 0.20 - 1.20 mg/dL 0.39  Alkaline Phos 40 - 150 U/L 95  AST 5 - 34 U/L 21  ALT 0 - 55 U/L 14     PATHOLOGY REPORT  Diagnosis 08/02/2016 1. Breast, simple mastectomy, Right - INVASIVE DUCTAL  CARCINOMA, GRADE I/III, SPANNING 0.6 CM. - DUCTAL CARCINOMA IN SITU WITH CALCIFICATIONS, HIGH GRADE. - DUCTAL CARCINOMA IN SITU IS FOCALLY PRESENT AT THE POSTERIOR MARGIN AND BROADLY LESS THAN 0.1 CM FROM THE SAME POSTERIOR MARGIN. - SEE ONCOLOGY TABLE BELOW. 2. Nipple Biopsy, Right - BENIGN BREAST PARENCHYMA. - THERE IS NO EVIDENCE OF MALIGNANCY. 3. Lymph node, sentinel, biopsy, Right Axillary - THERE IS NO EVIDENCE OF CARCINOMA OF 1 OF 1 LYMPH NODE (0/1).  Microscopic Comment 1. BREAST, INVASIVE TUMOR, WITH LYMPH NODES PRESENT Specimen, including laterality and lymph node sampling (sentinel, non-sentinel): Right breast and right axillary lymph node. Procedure: Nipple sparing mastectomy and right axillary lymph node resection x 1. Histologic type: Ductal. Grade: I Tubule formation: 2. Nuclear pleomorphism: 2. Mitotic: 1. Tumor size (glass slide measurement): 0.6 cm. Margins: Invasive, distance to closest margin: Greater than 0.2 cm to all margins. In-situ, distance to closest margin: Focally present at the posterior margin and broadly less than 0.1 cm from the same posterior margin. Lymphovascular invasion: Not identified. Ductal carcinoma in situ: Present. Grade: High grade. Extensive intraductal component: Yes. Lobular neoplasia: Not identified. Tumor focality: Unifocal. Treatment effect: N/A. Extent of tumor: Confined to breast parenchyma. Lymph nodes: Examined: 1 Sentinel 0 Non-sentinel 1 Total Lymph nodes with metastasis: 0. Breast prognostic profile: Will be attempted on the invasive component of the current case and the results reported separately. Non-neoplastic breast: Fibroadenomas and fibrocystic changes with adenosis and calcifications. TNM: pT1b, pN0. Comments: Dr. Donne Hazel was paged on 08/08/16. (JBK:ds 08/08/16) Enid Cutter MD Pathologist, Electronic Signature (Case signed 08/08/2016)  Estrogen Receptor: 100%, POSITIVE, STRONG STAINING  INTENSITY Progesterone Receptor: 0%, NEGATIVE Proliferation Marker Ki67: 5%  1. FLUORESCENCE IN-SITU HYBRIDIZATION Results: HER2 - NEGATIVE RATIO OF HER2/CEP17 SIGNALS 1.48 AVERAGE HER2 COPY NUMBER PER CELL 1.85  Diagnosis 05/10/2016 Breast, right, needle core biopsy, LIQ - DUCTAL CARCINOMA IN SITU WITH CALCIFICATIONS. - SEE COMMENT. Microscopic Comment The carcinoma is high grade. Estrogen receptor and progesterone receptor studies will be performed and the results reported separately. The results were called to [facility] on 05/11/2016. (JBK:ds 05/11/16)  The ductal carcinoma in situ cells are strongly positive for estrogen receptor (95%) and strongly positive for progesterone receptor (5%). (JBK:ecj 06/11/2016)   RADIOGRAPHIC STUDIES: I have personally reviewed the radiological images as listed and agreed with the  findings in the report. Nm Sentinel Node Inj-no Rpt (breast)  Result Date: 08/02/2016 CLINICAL DATA: right axillary sn biopsy Sulfur colloid was injected intradermally by the nuclear medicine technologist for breast cancer sentinel node localization.   MR breast b/l w wo contrast 06/19/2016 IMPRESSION: 1. Extensive linear non mass enhancement involving the entire lower right breast, including the lower inner quadrant and the lower outer quadrant, spanning approximately 6.4 x 5.5 x 3.0 cm, including the area of biopsy proven DCIS. Extensive DCIS is suspected. This area of enhancement is much larger than the calcifications identified on recent mammography. 2. No pathologic lymphadenopathy. Multiple normal-appearing right axillary lymph nodes. 3. No MRI evidence of malignancy involving the left breast.  MM diagnostic R 05/01/2016 FINDINGS: Spot magnification CC and lateral views of the right breast are      submitted. There is a group of indeterminate microcalcifications in the medial lower right breast measuring 3.8 x 3.3 x 2.6  cm.  IMPRESSION: Suspicious findings.  RECOMMENDATION: Stereotactic core biopsy right breast calcifications   ASSESSMENT & PLAN: 65 year old Caucasian female, presented with screening discovered a right breast DCIS.  1. 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 -Her initial biopsy only showed DCIS, but the final surgical path showed small invasive ductal carcinoma, grade 1, 0.6 cm, and high-grade DCIS. -Giving the small size of the invasive tumor, low-grade, strongly ER positivity, I do not think she needs adjuvant chemotherapy, and we'll not order Oncotype. -We discussed the risk of recurrence after surgery. She does have positive posterior margin for DCIS, but Dr. Donne Hazel does not think he can resect more margins. Her case was discussed in breast tumor conference earlier this week, adjuvant radiation was not recommended. --Given the strong ER and PR positivity, I do recommend adjuvant aromatase inhibitor to reduce her risk of cancer recurrence,  The potential benefit and side effects, which includes but not limited to, hot flash, skin and vaginal dryness, metabolic changes ( increased blood glucose, cholesterol, weight, etc.), slightly in increased risk of cardiovascular disease, cataracts, muscular and joint discomfort, osteopenia and osteoporosis, etc, were discussed with her in great details. She is interested, and we'll start in a few weeks.  -We discussed breast cancer surveillance after she completes treatment, Including annual mammogram, breast exam every 6-12 months.  2. Genetics -She is positive family history of breast cancer and multiple other malignancies, she is interested in genetics, I'll refer her  3. Bone health  -We discussed that  aromatase inhibitor may weak her bone -I recommend her to have a baseline bone density scan, and follow-up every 2 years  Plan -She will start letrozole on 10/1, I called it in to her pharmacy today -I will see her back in 2 months for follow up -lab today     Orders Placed This Encounter  Procedures  . CBC with Differential    Standing Status:   Standing    Number of Occurrences:   20    Standing Expiration Date:   08/23/2026  . Comprehensive metabolic panel    Standing Status:   Standing    Number of Occurrences:   20    Standing Expiration Date:   08/23/2026    All questions were answered. The patient knows to call the clinic with any problems,  questions or concerns. I spent 55 minutes counseling the patient face to face. The total time spent in the appointment was 60 minutes and more than 50% was on counseling.     Truitt Merle, MD 08/23/2016

## 2016-08-23 ENCOUNTER — Telehealth: Payer: Self-pay | Admitting: Hematology

## 2016-08-23 ENCOUNTER — Encounter: Payer: Self-pay | Admitting: Hematology

## 2016-08-23 ENCOUNTER — Ambulatory Visit (HOSPITAL_BASED_OUTPATIENT_CLINIC_OR_DEPARTMENT_OTHER): Payer: BLUE CROSS/BLUE SHIELD

## 2016-08-23 ENCOUNTER — Ambulatory Visit (HOSPITAL_BASED_OUTPATIENT_CLINIC_OR_DEPARTMENT_OTHER): Payer: BLUE CROSS/BLUE SHIELD | Admitting: Hematology

## 2016-08-23 VITALS — BP 131/72 | HR 88 | Temp 98.2°F | Resp 18 | Ht 60.0 in | Wt 141.7 lb

## 2016-08-23 DIAGNOSIS — Z170 Estrogen receptor positive status [ER+]: Secondary | ICD-10-CM | POA: Diagnosis not present

## 2016-08-23 DIAGNOSIS — C50311 Malignant neoplasm of lower-inner quadrant of right female breast: Secondary | ICD-10-CM

## 2016-08-23 LAB — COMPREHENSIVE METABOLIC PANEL
ALBUMIN: 3.9 g/dL (ref 3.5–5.0)
ALK PHOS: 95 U/L (ref 40–150)
ALT: 14 U/L (ref 0–55)
ANION GAP: 11 meq/L (ref 3–11)
AST: 21 U/L (ref 5–34)
BUN: 13.5 mg/dL (ref 7.0–26.0)
CALCIUM: 9.6 mg/dL (ref 8.4–10.4)
CO2: 25 mEq/L (ref 22–29)
Chloride: 106 mEq/L (ref 98–109)
Creatinine: 1 mg/dL (ref 0.6–1.1)
EGFR: 67 mL/min/{1.73_m2} — AB (ref >90)
Glucose: 82 mg/dl (ref 70–140)
POTASSIUM: 3.9 meq/L (ref 3.5–5.1)
Sodium: 142 mEq/L (ref 136–145)
Total Bilirubin: 0.39 mg/dL (ref 0.20–1.20)
Total Protein: 8 g/dL (ref 6.4–8.3)

## 2016-08-23 LAB — CBC WITH DIFFERENTIAL/PLATELET
BASO%: 0.4 % (ref 0.0–2.0)
BASOS ABS: 0 10*3/uL (ref 0.0–0.1)
EOS ABS: 0.2 10*3/uL (ref 0.0–0.5)
EOS%: 3 % (ref 0.0–7.0)
HEMATOCRIT: 36.7 % (ref 34.8–46.6)
HEMOGLOBIN: 12.4 g/dL (ref 11.6–15.9)
LYMPH#: 2 10*3/uL (ref 0.9–3.3)
LYMPH%: 37.4 % (ref 14.0–49.7)
MCH: 28.5 pg (ref 25.1–34.0)
MCHC: 33.8 g/dL (ref 31.5–36.0)
MCV: 84.4 fL (ref 79.5–101.0)
MONO#: 0.5 10*3/uL (ref 0.1–0.9)
MONO%: 8.3 % (ref 0.0–14.0)
NEUT#: 2.8 10*3/uL (ref 1.5–6.5)
NEUT%: 50.9 % (ref 38.4–76.8)
PLATELETS: 311 10*3/uL (ref 145–400)
RBC: 4.35 10*6/uL (ref 3.70–5.45)
RDW: 13.6 % (ref 11.2–14.5)
WBC: 5.4 10*3/uL (ref 3.9–10.3)

## 2016-08-23 MED ORDER — LETROZOLE 2.5 MG PO TABS: 2.5000 mg | ORAL_TABLET | Freq: Every day | ORAL | 2 refills | Status: DC

## 2016-08-23 NOTE — Telephone Encounter (Signed)
Lab and F/u appt schd. Genetic appt schd. Avs report and appointment schedule given to patient, per 08/23/16 los.

## 2016-08-26 ENCOUNTER — Encounter: Payer: Self-pay | Admitting: Hematology

## 2016-08-27 ENCOUNTER — Telehealth: Payer: Self-pay | Admitting: *Deleted

## 2016-08-27 DIAGNOSIS — C50311 Malignant neoplasm of lower-inner quadrant of right female breast: Secondary | ICD-10-CM

## 2016-08-27 NOTE — Telephone Encounter (Signed)
Left vm to give navigation resources. Contact information provided.

## 2016-09-06 ENCOUNTER — Ambulatory Visit: Payer: BLUE CROSS/BLUE SHIELD | Admitting: Radiation Oncology

## 2016-09-06 ENCOUNTER — Ambulatory Visit: Payer: BLUE CROSS/BLUE SHIELD

## 2016-10-09 ENCOUNTER — Other Ambulatory Visit: Payer: BLUE CROSS/BLUE SHIELD

## 2016-10-09 ENCOUNTER — Ambulatory Visit (HOSPITAL_BASED_OUTPATIENT_CLINIC_OR_DEPARTMENT_OTHER): Payer: BLUE CROSS/BLUE SHIELD | Admitting: Genetic Counselor

## 2016-10-09 ENCOUNTER — Encounter: Payer: Self-pay | Admitting: Genetic Counselor

## 2016-10-09 DIAGNOSIS — Z8042 Family history of malignant neoplasm of prostate: Secondary | ICD-10-CM

## 2016-10-09 DIAGNOSIS — C50311 Malignant neoplasm of lower-inner quadrant of right female breast: Secondary | ICD-10-CM | POA: Diagnosis not present

## 2016-10-09 DIAGNOSIS — Z806 Family history of leukemia: Secondary | ICD-10-CM

## 2016-10-09 DIAGNOSIS — Z803 Family history of malignant neoplasm of breast: Secondary | ICD-10-CM | POA: Diagnosis not present

## 2016-10-09 DIAGNOSIS — Z800 Family history of malignant neoplasm of digestive organs: Secondary | ICD-10-CM

## 2016-10-09 DIAGNOSIS — Z170 Estrogen receptor positive status [ER+]: Secondary | ICD-10-CM

## 2016-10-09 DIAGNOSIS — Z315 Encounter for genetic counseling: Secondary | ICD-10-CM

## 2016-10-09 NOTE — Progress Notes (Signed)
REFERRING PROVIDER: Truitt Merle, MD  PRIMARY PROVIDER:  No primary care provider on file.  PRIMARY REASON FOR VISIT:  1. Malignant neoplasm of lower-inner quadrant of right breast of female, estrogen receptor positive ([place])   2. Family history of breast cancer in female   3. Family history of pancreatic cancer   4. Family history of colon cancer   5. Family history of stomach cancer   6. Family history of leukemia   7. Family history of prostate cancer      HISTORY OF PRESENT ILLNESS:   Hayley Marks, a 65 y.o. female, was seen for a [place] cancer genetics consultation at the request of Dr. Burr Medico due to a personal history of breast cancer and family history of breast and other cancers.  Hayley Marks presents to clinic today with her husband, Hayley Marks, to discuss the possibility of a hereditary predisposition to cancer, genetic testing, and to further clarify her future cancer risks, as well as potential cancer risks for family members.   In June 2017, at the age of 55, Hayley Marks was diagnosed with invasive ductal carcinoma with DCIS of the right breast. Hormone receptor status was ER+, PR-, and Her2-.  This was treated with right nipple-sparing mastectomy and breast reconstruction.  Hayley Marks reports no additional personal history of cancer.    CANCER HISTORY:  Oncology History   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 female breast [facility])   Staging form: Breast, AJCC 7th Edition   - Clinical stage from 05/10/2016: Stage 0 (Tis (DCIS), N0, cM0(i+)) - Signed by Truitt Merle, MD on 08/22/2016   - Pathologic stage from 08/02/2016: Stage IA (T1b, N0, cM0) - Signed by Truitt Merle, MD on 08/22/2016      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 female breast ([place])   05/01/2016 Mammogram    Diagnostic mammo showed a group microcalcification in the medial lower right breast measuring 3.8X3.3X2.6cm       05/10/2016 Initial Diagnosis    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 female breast  ([place])      05/10/2016 Initial Biopsy    Right breast biopsy showed DCIS      05/10/2016 Receptors her2    ER 95%+, PR 5%+      06/19/2016 Imaging    Breast MRI showed extensive non-mass enhancement involving the entire lower right breast, 6.4X5.5X3.0cm, no adenopathy, left breast (-)       08/02/2016 Surgery    Right mastectomy and SLN biopsy       08/02/2016 Pathology Results    Right mastectomy showed invasive ductal carcinoma, G1, and high grade DCIS, DCIS was focally positive at posterior margin and broadly less than 0.1cm from the same posterior margin, 1 SLN was negative, nipple biopsy was negative       08/02/2016 Receptors her2    ER 100% strongly +, PR-, HER2-, Ki67 5%         HORMONAL RISK FACTORS:  Menarche was at age 85.  First live birth at age 18.  OCP use for approximately 15 years, remotely.  Ovaries intact: reports that she has one ovary remaining.  Hysterectomy: yes, hysterectomy with USO in 1993 to address fibroids.  Menopausal status: postmenopausal.  HRT use: 0 years. Colonoscopy: yes; most recent colonoscopy was within the last year; reports no history of polyps; is on a 5-year colonscopy schedule. Mammogram within the last year: yes. Number of breast biopsies: 1. Up to date with pelvic exams:  yes. Any  excessive radiation exposure/other exposures in the past:  Reports remote history of secondhand smoke exposure  Past Medical History:  Diagnosis Date  . Asthma    as child  . Cancer ([place])    DCIS right breast  . GERD (gastroesophageal reflux disease)   . Scoliosis     Past Surgical History:  Procedure Laterality Date  . ABDOMINAL HYSTERECTOMY  1993  . BREAST RECONSTRUCTION WITH PLACEMENT OF TISSUE EXPANDER AND FLEX HD (ACELLULAR HYDRATED DERMIS) Right 08/02/2016   Procedure: RIGHT BREAST RECONSTRUCTION WITH PLACEMENT OF TISSUE EXPANDER;  Surgeon: Irene Limbo, MD;  Location: [place];  Service: Plastics;  Laterality: Right;   . NIPPLE SPARING MASTECTOMY/SENTINAL LYMPH NODE BIOPSY/RECONSTRUCTION/PLACEMENT OF TISSUE EXPANDER Right 08/02/2016   Procedure: RIGHT NIPPLE SPARING MASTECTOMY WITH SENTINAL LYMPH NODE BIOPSY;  Surgeon: Rolm Bookbinder, MD;  Location: [place];  Service: General;  Laterality: Right;    Social History   Social History  . Marital status: Married    Spouse name: N/A  . Number of children: N/A  . Years of education: N/A   Social History Main Topics  . Smoking status: Never Smoker  . Smokeless tobacco: Never Used  . Alcohol use Yes     Comment: social  . Drug use: No  . Sexual activity: Not Asked   Other Topics Concern  . None   Social History Narrative  . None     FAMILY HISTORY:  We obtained a detailed, 4-generation family history.  Significant diagnoses are listed below: Family History  Problem Relation Age of Onset  . Breast cancer Maternal Aunt     dx. 50-51y; d. 54-55y  . Colon cancer Maternal Grandmother 80    d. 70y  . Cancer Maternal Grandfather     gastric and pancreatic cancer; dx 82-83y; d. 83y  . Colon polyps Mother     "a few"  . Dementia Mother   . Diabetes Maternal Uncle   . Other Maternal Uncle     dx renal tumor, NOS in his 23s  . Heart attack Paternal Grandfather   . Congestive Heart Failure Paternal Grandfather   . Fibrocystic breast disease Cousin   . Other Cousin     maternal 1st cousins hx partial colon resection, maybe due to diverticulosis  . Heart attack Paternal Uncle     d. 47  . Diabetes Paternal Uncle   . Congestive Heart Failure Paternal Uncle     d. 75y  . Leukemia Cousin     paternal 1st cousin d. 35y  . Prostate cancer Cousin     paternal 1st cousin dx prostate cancer    Hayley Marks has two sons, ages 79 and 60, and neither have ever had cancer.  She has two grandsons and one granddaughter.  Hayley Marks is an only child.  Her mother is currently 45 and has never had cancer.  She reports that her mother has had "a  few" colon polyps.  Hayley Marks father is currently 24 and has never had cancer.  Hayley Marks mother has one paternal half-brother, one full brother, and four full sisters.  The full brother is currently 13 and has never had cancer; the other siblings have passed away.  Her paternal half-brother died from what may have been diabetes-related complications in his early 53s.  Hayley Marks reports that he also had a kidney tumor that was found around that time.  One sister was diagnosed with breast cancer at age 16-51 and passed  away at 54-55.  The other three sisters all died at young ages in a house fire (the oldest sister was 34 at the time).  Hayley Marks is unaware of any cancer history for any maternal first cousins.  Her maternal grandmother was diagnosed with and passed away from colon cancer at 58.  Her grandfather died of from pancreatic and stomach cancers at age 15--these cancers were diagnosed approximately six months before.  Hayley Marks has limited information for her maternal great aunts/uncles and great grandparents.  Hayley Marks father had two full sisters and three full brothers, all of whom have passed away.  One sister died at age 15 from an unspecified cause.  The other sister and brother died at age 10-19 from tuberculosis.  One brother died of a heart attack at 46.  He had five sons, and two of his sons had cancer--one was diagnosed with prostate cancer and another died of leukemia at 29.  The other brother died of congestive heart failure at 45.  He had two sons, neither of whom have had cancer.  Hayley Marks paternal grandmother died of unspecified health issues at 29.  Hayley Marks grandfather died of heart attack and congestive heart failure at 74.  She has limited information for her paternal great aunts/uncles and great grandparents.  Hayley Marks reports no known family history of genetic testing for hereditary cancer risks.  Patient's maternal ancestors are of [place] American and Caucasian  descent, and paternal ancestors are of African American descent. There is no reported Ashkenazi Jewish ancestry. There is no known consanguinity.  GENETIC COUNSELING ASSESSMENT: Hayley Marks is a 65 y.o. female with a personal history of breast cancer and family history of breast and other cancers which is somewhat suggestive of a hereditary breast cancer syndrome and predisposition to cancer. We, therefore, discussed and recommended the following at today's visit.   DISCUSSION: We reviewed the characteristics, features and inheritance patterns of hereditary cancer syndromes, particularly those caused by mutations within the BRCA1/2 genes. We also discussed genetic testing, including the appropriate family members to test, the process of testing, insurance coverage and turn-around-time for results. We discussed the implications of a negative, positive and/or variant of uncertain significant result. We recommended Hayley Marks pursue genetic testing for the 42-gene [facility] Common Hereditary Cancers Panel (Breast, Gyn, GI) through [facility].  The 42-gene [facility] Common Hereditary Cancers Panel (Breast, Gyn, GI) performed by [facility] [place]) includes sequencing and/or deletion/duplication analysis for the following genes: APC, ATM, AXIN2, BARD1, BMPR1A, BRCA1, BRCA2, BRIP1, CDH1, CDKN2A, CHEK2, DICER1, EPCAM, GREM1, KIT, MEN1, MLH1, MSH2, MSH6, MUTYH, NBN, NF1, PALB2, PDGFRA, PMS2, POLD1, POLE, PTEN, RAD50, RAD51C, RAD51D, SDHA, SDHB, SDHC, SDHD, SMAD4, SMARCA4, STK11, TP53, TSC1, TSC2, and VHL.   Based on Hayley Marks's personal and family history of cancer, she meets medical criteria for genetic testing. Despite that she meets criteria, she may still have an out of pocket cost. We discussed that if her out of pocket cost for testing is over $100, the laboratory will call and confirm whether she wants to proceed with testing.  If the out of pocket cost of testing is less than $100  she will be billed by the genetic testing laboratory.   PLAN: After considering the risks, benefits, and limitations, Ms. Vanderhoff  provided informed consent to pursue genetic testing and the blood sample was sent to [facility] for analysis of the 42-gene [facility] Common Hereditary Cancers Panel (Breast, Gyn, GI). Results should be  available within approximately 2-3 weeks' time, at which point they will be disclosed by telephone to Ms. Carton, as will any additional recommendations warranted by these results. Ms. Pho will receive a summary of her genetic counseling visit and a copy of her results once available. This information will also be available in Epic. We encouraged Ms. Dingledine to remain in contact with cancer genetics annually so that we can continuously update the family history and inform her of any changes in cancer genetics and testing that may be of benefit for her family. Ms. Dembeck questions were answered to her satisfaction today. Our contact information was provided should additional questions or concerns arise.  Thank you for the referral and allowing [place] to share in the care of your patient.   Jeanine Luz, MS, [facility] Certified Genetic Counselor [email or web address] Phone: [phone]  The patient was seen for a total of 60 minutes in face-to-face genetic counseling.  This patient was discussed with Drs. Magrinat, Lindi Adie and/or Burr Medico who agrees with the above.    _______________________________________________________________________ For Office Staff:  Number of people involved in session: 2 Was an Intern/ student involved with case: no

## 2016-10-22 ENCOUNTER — Encounter: Payer: Self-pay | Admitting: Hematology

## 2016-10-22 ENCOUNTER — Other Ambulatory Visit (HOSPITAL_BASED_OUTPATIENT_CLINIC_OR_DEPARTMENT_OTHER): Payer: BLUE CROSS/BLUE SHIELD

## 2016-10-22 ENCOUNTER — Ambulatory Visit (HOSPITAL_BASED_OUTPATIENT_CLINIC_OR_DEPARTMENT_OTHER): Payer: BLUE CROSS/BLUE SHIELD | Admitting: Hematology

## 2016-10-22 ENCOUNTER — Telehealth: Payer: Self-pay | Admitting: Hematology

## 2016-10-22 VITALS — BP 126/64 | HR 86 | Temp 98.5°F | Resp 18 | Ht 60.0 in | Wt 141.6 lb

## 2016-10-22 DIAGNOSIS — C50311 Malignant neoplasm of lower-inner quadrant of right female breast: Secondary | ICD-10-CM

## 2016-10-22 DIAGNOSIS — Z170 Estrogen receptor positive status [ER+]: Secondary | ICD-10-CM

## 2016-10-22 LAB — COMPREHENSIVE METABOLIC PANEL
ALT: 17 U/L (ref 0–55)
ANION GAP: 9 meq/L (ref 3–11)
AST: 22 U/L (ref 5–34)
Albumin: 3.8 g/dL (ref 3.5–5.0)
Alkaline Phosphatase: 88 U/L (ref 40–150)
BUN: 14.6 mg/dL (ref 7.0–26.0)
CALCIUM: 9.7 mg/dL (ref 8.4–10.4)
CHLORIDE: 107 meq/L (ref 98–109)
CO2: 25 mEq/L (ref 22–29)
CREATININE: 0.9 mg/dL (ref 0.6–1.1)
EGFR: 78 mL/min/{1.73_m2} — AB (ref >90)
Glucose: 88 mg/dl (ref 70–140)
Potassium: 4.2 mEq/L (ref 3.5–5.1)
Sodium: 141 mEq/L (ref 136–145)
Total Bilirubin: 0.33 mg/dL (ref 0.20–1.20)
Total Protein: 7.6 g/dL (ref 6.4–8.3)

## 2016-10-22 LAB — CBC WITH DIFFERENTIAL/PLATELET
BASO%: 0.6 % (ref 0.0–2.0)
BASOS ABS: 0 10*3/uL (ref 0.0–0.1)
EOS ABS: 0.1 10*3/uL (ref 0.0–0.5)
EOS%: 1.9 % (ref 0.0–7.0)
HEMATOCRIT: 38.1 % (ref 34.8–46.6)
HGB: 12.3 g/dL (ref 11.6–15.9)
LYMPH#: 2 10*3/uL (ref 0.9–3.3)
LYMPH%: 38.9 % (ref 14.0–49.7)
MCH: 27.4 pg (ref 25.1–34.0)
MCHC: 32.3 g/dL (ref 31.5–36.0)
MCV: 84.8 fL (ref 79.5–101.0)
MONO#: 0.4 10*3/uL (ref 0.1–0.9)
MONO%: 7.2 % (ref 0.0–14.0)
NEUT#: 2.6 10*3/uL (ref 1.5–6.5)
NEUT%: 51.4 % (ref 38.4–76.8)
PLATELETS: 258 10*3/uL (ref 145–400)
RBC: 4.49 10*6/uL (ref 3.70–5.45)
RDW: 14.1 % (ref 11.2–14.5)
WBC: 5.1 10*3/uL (ref 3.9–10.3)

## 2016-10-22 NOTE — Telephone Encounter (Signed)
Appointments scheduled per 11/20 LOS. Patient given AVS report and calendars with future scheduled appointments. °

## 2016-10-22 NOTE — Progress Notes (Signed)
[place]  [phone] [phone]  Clinic Follow up Note   Patient Care Team: Rolm Bookbinder, MD as Consulting Physician (General Surgery) 10/22/2016    CHIEF COMPLAINTS:  Follow up right breast cancer  Oncology History   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 female breast [facility])   Staging form: Breast, AJCC 7th Edition   - Clinical stage from 05/10/2016: Stage 0 (Tis (DCIS), N0, cM0(i+)) - Signed by Truitt Merle, MD on 08/22/2016   - Pathologic stage from 08/02/2016: Stage IA (T1b, N0, cM0) - Signed by Truitt Merle, MD on 08/22/2016      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 female breast ([place])   05/01/2016 Mammogram    Diagnostic mammo showed a group microcalcification in the medial lower right breast measuring 3.8X3.3X2.6cm       05/10/2016 Initial Diagnosis    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 female breast ([place])      05/10/2016 Initial Biopsy    Right breast biopsy showed DCIS      05/10/2016 Receptors her2    ER 95%+, PR 5%+      06/19/2016 Imaging    Breast MRI showed extensive non-mass enhancement involving the entire lower right breast, 6.4X5.5X3.0cm, no adenopathy, left breast (-)       08/02/2016 Surgery    Right mastectomy and SLN biopsy       08/02/2016 Pathology Results    Right mastectomy showed invasive ductal carcinoma, G1, and high grade DCIS, DCIS was focally positive at posterior margin and broadly less than 0.1cm from the same posterior margin, 1 SLN was negative, nipple biopsy was negative       08/02/2016 Receptors her2    ER 100% strongly +, PR-, HER2-, Ki67 5%        HISTORY OF PRESENTING ILLNESS:  Everette Dimauro 65 y.o. female is here because of her right breast cancer. She presents to the clinic by herself today.  This was discovered by screening mammogram. She never had abnormal mammogram or biopsy before. She underwent diagnostic mammogram and ultrasound on 05/01/2016, which showed  a group of calcification in the right breast, measuring 3.8 cm. Core needle biopsy showed DCIS. ER/PR positive. She underwent breast MRI on 06/19/2016, which showed extensive non-mass enhancement involving the entire lower right breast, measuring 6.4 cm. Dr. Donne Hazel recommended right mastectomy and sentinel lymph node biopsy, which was done on 08/02/2016. Final surgical path showed small focus of invasive ductal carcinoma, and high-grade DCIS.   She has been recovering well from surgery, pain is mild, she takes Tylenol as needed. No other complaints.  GYN HISTORY  Menarchal: 14 LMP: hysrectomy in 1993 Contraceptive: no  HRT: no  G4P2:   CURRENT THERAPY: letrozole 2.37m daily, started on 09/06/2016  INTERIM HISTORY: CHoyle Sauerreturns for follow up. She has been on letrozole for 6 weeks, tolerating well so far. She had a few episodes of hot flash, moderate, manageable. No significant joint stiffness or arthralgia. She has good appetite and energy level, feels well overall, denies any other new symptoms.   MEDICAL HISTORY:  Past Medical History:  Diagnosis Date  . Asthma    as child  . Cancer ([place]    DCIS right breast  . GERD (gastroesophageal reflux disease)   . Scoliosis     SURGICAL HISTORY: Past Surgical History:  Procedure Laterality Date  . ABDOMINAL HYSTERECTOMY  1993  . BREAST RECONSTRUCTION WITH PLACEMENT OF TISSUE EXPANDER AND FLEX HD (ACELLULAR HYDRATED DERMIS)  Right 08/02/2016   Procedure: RIGHT BREAST RECONSTRUCTION WITH PLACEMENT OF TISSUE EXPANDER;  Surgeon: Irene Limbo, MD;  Location: [place];  Service: Plastics;  Laterality: Right;  . NIPPLE SPARING MASTECTOMY/SENTINAL LYMPH NODE BIOPSY/RECONSTRUCTION/PLACEMENT OF TISSUE EXPANDER Right 08/02/2016   Procedure: RIGHT NIPPLE SPARING MASTECTOMY WITH SENTINAL LYMPH NODE BIOPSY;  Surgeon: Rolm Bookbinder, MD;  Location[place];  Service: General;  Laterality: Right;    SOCIAL  HISTORY: Social History   Social History  . Marital status: Married    Spouse name: N/A  . Number of children: N/A  . Years of education: N/A   Occupational History  . Not on file.   Social History Main Topics  . Smoking status: Never Smoker  . Smokeless tobacco: Never Used  . Alcohol use Yes     Comment: social  . Drug use: No  . Sexual activity: Not on file   Other Topics Concern  . Not on file   Social History Narrative  . No narrative on file   She is divorced, she has 2 sons, 20 and 34 yo   She is a retired Marine scientist, she used to Hotel manager in college.   FAMILY HISTORY: Family History  Problem Relation Age of Onset  . Breast cancer Maternal Aunt     dx. 50-51y; d. 54-55y  . Colon cancer Maternal Grandmother 80    d. 36y  . Cancer Maternal Grandfather     gastric and pancreatic cancer; dx 82-83y; d. 83y  . Colon polyps Mother     "a few"  . Dementia Mother   . Diabetes Maternal Uncle   . Other Maternal Uncle     dx renal tumor, NOS in his 40s  . Heart attack Paternal Grandfather   . Congestive Heart Failure Paternal Grandfather   . Fibrocystic breast disease Cousin   . Other Cousin     maternal 1st cousins hx partial colon resection, maybe due to diverticulosis  . Heart attack Paternal Uncle     d. 58  . Diabetes Paternal Uncle   . Congestive Heart Failure Paternal Uncle     d. 75y  . Leukemia Cousin     paternal 1st cousin d. 87y  . Prostate cancer Cousin     paternal 1st cousin dx prostate cancer    ALLERGIES:  is allergic to penicillins and other.  MEDICATIONS:  Current Outpatient Prescriptions  Medication Sig Dispense Refill  . aspirin EC 81 MG tablet Take 81 mg by mouth daily.    Marland Kitchen CALCIUM-VITAMINS C & D PO Take 1 tablet by mouth. Calcium 600 + Vit D.    . cyclobenzaprine (FLEXERIL) 10 MG tablet Take 10 mg by mouth 3 (three) times daily as needed for muscle spasms.    Marland Kitchen letrozole (FEMARA) 2.5 MG tablet Take 1 tablet (2.5 mg total) by mouth  daily. 30 tablet 2  . Multiple Vitamin (MULTIVITAMIN WITH MINERALS) TABS tablet Take 1 tablet by mouth daily.    . ranitidine (ZANTAC) 150 MG tablet Take 150 mg by mouth 2 (two) times daily.    Marland Kitchen oxyCODONE (ROXICODONE) 5 MG immediate release tablet Take 1 tablet (5 mg total) by mouth every 4 (four) hours as needed for severe pain. (Patient not taking: Reported on 10/22/2016) 50 tablet 0   No current facility-administered medications for this visit.     REVIEW OF SYSTEMS:   Constitutional: Denies fevers, chills or abnormal night sweats Eyes: Denies blurriness of vision, double vision  or watery eyes Ears, nose, mouth, throat, and face: Denies mucositis or sore throat Respiratory: Denies cough, dyspnea or wheezes Cardiovascular: Denies palpitation, chest discomfort or lower extremity swelling Gastrointestinal:  Denies nausea, heartburn or change in bowel habits Skin: Denies abnormal skin rashes Lymphatics: Denies new lymphadenopathy or easy bruising Neurological:Denies numbness, tingling or new weaknesses Behavioral/Psych: Mood is stable, no new changes  All other systems were reviewed with the patient and are negative.  PHYSICAL EXAMINATION: ECOG PERFORMANCE STATUS: 0 - Asymptomatic  Vitals:   10/22/16 1403  BP: 126/64  Pulse: 86  Resp: 18  Temp: 98.5 F (36.9 C)   Filed Weights   10/22/16 1403  Weight: 141 lb 9.6 oz (64.2 kg)    GENERAL:alert, no distress and comfortable SKIN: skin color, texture, turgor are normal, no rashes or significant lesions EYES: normal, conjunctiva are pink and non-injected, sclera clear OROPHARYNX:no exudate, no erythema and lips, buccal mucosa, and tongue normal  NECK: supple, thyroid normal size, non-tender, without nodularity LYMPH:  no palpable lymphadenopathy in the cervical, axillary or inguinal LUNGS: clear to auscultation and percussion with normal breathing effort HEART: regular rate & rhythm and no murmurs and no lower extremity  edema ABDOMEN:abdomen soft, non-tender and normal bowel sounds Musculoskeletal:no cyanosis of digits and no clubbing  PSYCH: alert & oriented x 3 with fluent speech NEURO: no focal motor/sensory deficits Breasts: Breast inspection showed right breast is surgically absent, Tissue expander in place, incision has healed well. No skin erythema or discharge. Palpation of the left breasts and axilla revealed no obvious mass that I could appreciate.   LABORATORY DATA:  I have reviewed the data as listed CBC Latest Ref Rng & Units 10/22/2016 08/23/2016  WBC 3.9 - 10.3 10e3/uL 5.1 5.4  Hemoglobin 11.6 - 15.9 g/dL 12.3 12.4  Hematocrit 34.8 - 46.6 % 38.1 36.7  Platelets 145 - 400 10e3/uL 258 311   CMP Latest Ref Rng & Units 10/22/2016 08/23/2016  Glucose 70 - 140 mg/dl 88 82  BUN 7.0 - 26.0 mg/dL 14.6 13.5  Creatinine 0.6 - 1.1 mg/dL 0.9 1.0  Sodium 136 - 145 mEq/L 141 142  Potassium 3.5 - 5.1 mEq/L 4.2 3.9  CO2 22 - 29 mEq/L 25 25  Calcium 8.4 - 10.4 mg/dL 9.7 9.6  Total Protein 6.4 - 8.3 g/dL 7.6 8.0  Total Bilirubin 0.20 - 1.20 mg/dL 0.33 0.39  Alkaline Phos 40 - 150 U/L 88 95  AST 5 - 34 U/L 22 21  ALT 0 - 55 U/L 17 14     PATHOLOGY REPORT  Diagnosis 08/02/2016 1. Breast, simple mastectomy, Right - INVASIVE DUCTAL CARCINOMA, GRADE I/III, SPANNING 0.6 CM. - DUCTAL CARCINOMA IN SITU WITH CALCIFICATIONS, HIGH GRADE. - DUCTAL CARCINOMA IN SITU IS FOCALLY PRESENT AT THE POSTERIOR MARGIN AND BROADLY LESS THAN 0.1 CM FROM THE SAME POSTERIOR MARGIN. - SEE ONCOLOGY TABLE BELOW. 2. Nipple Biopsy, Right - BENIGN BREAST PARENCHYMA. - THERE IS NO EVIDENCE OF MALIGNANCY. 3. Lymph node, sentinel, biopsy, Right Axillary - THERE IS NO EVIDENCE OF CARCINOMA OF 1 OF 1 LYMPH NODE (0/1).  Microscopic Comment 1. BREAST, INVASIVE TUMOR, WITH LYMPH NODES PRESENT Specimen, including laterality and lymph node sampling (sentinel, non-sentinel): Right breast and right axillary lymph node. Procedure:  Nipple sparing mastectomy and right axillary lymph node resection x 1. Histologic type: Ductal. Grade: I Tubule formation: 2. Nuclear pleomorphism: 2. Mitotic: 1. Tumor size (glass slide measurement): 0.6 cm. Margins: Invasive, distance to closest margin: Greater than 0.2 cm to  all margins. In-situ, distance to closest margin: Focally present at the posterior margin and broadly less than 0.1 cm from the same posterior margin. Lymphovascular invasion: Not identified. Ductal carcinoma in situ: Present. Grade: High grade. Extensive intraductal component: Yes. Lobular neoplasia: Not identified. Tumor focality: Unifocal. Treatment effect: N/A. Extent of tumor: Confined to breast parenchyma. Lymph nodes: Examined: 1 Sentinel 0 Non-sentinel 1 Total Lymph nodes with metastasis: 0. Breast prognostic profile: Will be attempted on the invasive component of the current case and the results reported separately. Non-neoplastic breast: Fibroadenomas and fibrocystic changes with adenosis and calcifications. TNM: pT1b, pN0. Comments: Dr. Donne Hazel was paged on 08/08/16. (JBK:ds 08/08/16) Enid Cutter MD Pathologist, Electronic Signature (Case signed 08/08/2016)  Estrogen Receptor: 100%, POSITIVE, STRONG STAINING INTENSITY Progesterone Receptor: 0%, NEGATIVE Proliferation Marker Ki67: 5%  1. FLUORESCENCE IN-SITU HYBRIDIZATION Results: HER2 - NEGATIVE RATIO OF HER2/CEP17 SIGNALS 1.48 AVERAGE HER2 COPY NUMBER PER CELL 1.85  Diagnosis 05/10/2016 Breast, right, needle core biopsy, LIQ - DUCTAL CARCINOMA IN SITU WITH CALCIFICATIONS. - SEE COMMENT. Microscopic Comment The carcinoma is high grade. Estrogen receptor and progesterone receptor studies will be performed and the results reported separately. The results were called to [facility] on 05/11/2016. (JBK:ds 05/11/16)  The ductal carcinoma in situ cells are strongly positive for estrogen receptor (95%) and strongly positive  for progesterone receptor (5%). (JBK:ecj 06/11/2016)   RADIOGRAPHIC STUDIES: I have personally reviewed the radiological images as listed and agreed with the findings in the report. No results found. MR breast b/l w wo contrast 06/19/2016 IMPRESSION: 1. Extensive linear non mass enhancement involving the entire lower right breast, including the lower inner quadrant and the lower outer quadrant, spanning approximately 6.4 x 5.5 x 3.0 cm, including the area of biopsy proven DCIS. Extensive DCIS is suspected. This area of enhancement is much larger than the calcifications identified on recent mammography. 2. No pathologic lymphadenopathy. Multiple normal-appearing right axillary lymph nodes. 3. No MRI evidence of malignancy involving the left breast.  MM diagnostic R 05/01/2016 FINDINGS: Spot magnification CC and lateral views of the right breast are      submitted. There is a group of indeterminate microcalcifications in the medial lower right breast measuring 3.8 x 3.3 x 2.6 cm.  IMPRESSION: Suspicious findings.  RECOMMENDATION: Stereotactic core biopsy right breast calcifications   ASSESSMENT & PLAN: 65 year old Caucasian female, presented with screening discovered a right breast DCIS.  1. 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 -Her initial biopsy only showed DCIS, but the final surgical path showed small invasive ductal carcinoma, grade 1, 0.6 cm, and high-grade DCIS. -Giving the small size of the invasive tumor, low-grade, strongly ER positivity, I do not think she needs adjuvant chemotherapy, and we'll not order Oncotype. -We discussed the risk of recurrence after surgery. She does have positive posterior margin for DCIS, but Dr. Donne Hazel  does not think he can resect more margins. Her case was discussed in breast tumor conference earlier this week, adjuvant radiation was not recommended. -She has started adjuvant letrozole, tolerating well, we'll continue for 5 years -We'll continue breast cancer surveillance with annual screening mammogram, self-exam, routine office visit and exam. -She is clinically doing very well, exam is unremarkable, lab reviewed, her CBC and CMP are normal. No clinical concern of  recurrence.  2. Genetics -She is positive family history of breast cancer and multiple other malignancies, she is interested in genetics, I'll refer her  3. Bone health  -We discussed that aromatase inhibitor may weak her bone -I recommend her to have a baseline bone density scan, and follow-up every 2 years  Plan -Lab reviewed -Continue letrozole -I will see her back in 3 months for follow up  No orders of the defined types were placed in this encounter.   All questions were answered. The patient knows to call the clinic with any problems, questions or concerns. I spent 15 minutes counseling the patient face to face. The total time spent in the appointment was 20 minutes and more than 50% was on counseling.     Truitt Merle, MD 10/22/2016

## 2016-10-31 ENCOUNTER — Telehealth: Payer: Self-pay | Admitting: Genetic Counselor

## 2016-10-31 NOTE — Telephone Encounter (Signed)
Discussed with Hayley Marks that her genetic test result was negative for any known pathogenic mutations within any of 42 genes on the [facility] Common Hereditary Cancers Panel (Breast, Gyn, GI) through [employer].  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  Discussed that we treat this just like a negative test result until it gets updated by the lab and reviewed why we do that.  Encouraged Hayley Marks to keep her phone number up-to-date with [place], so that we can call her once we learn more.  Hayley Marks should continue to follow her doctors' recommendations for future cancer screening.  Hayley Marks is welcome to call with any questions she may have.  I will mail her a copy of her results.

## 2016-11-01 ENCOUNTER — Ambulatory Visit: Payer: Self-pay | Admitting: Genetic Counselor

## 2016-11-01 DIAGNOSIS — Z170 Estrogen receptor positive status [ER+]: Secondary | ICD-10-CM

## 2016-11-01 DIAGNOSIS — Z800 Family history of malignant neoplasm of digestive organs: Secondary | ICD-10-CM

## 2016-11-01 DIAGNOSIS — Z1379 Encounter for other screening for genetic and chromosomal anomalies: Secondary | ICD-10-CM

## 2016-11-01 DIAGNOSIS — C50311 Malignant neoplasm of lower-inner quadrant of right female breast: Secondary | ICD-10-CM

## 2016-11-01 DIAGNOSIS — Z809 Family history of malignant neoplasm, unspecified: Secondary | ICD-10-CM

## 2016-11-01 DIAGNOSIS — Z803 Family history of malignant neoplasm of breast: Secondary | ICD-10-CM

## 2016-11-18 DIAGNOSIS — Z1379 Encounter for other screening for genetic and chromosomal anomalies: Secondary | ICD-10-CM | POA: Insufficient documentation

## 2016-11-18 NOTE — Progress Notes (Signed)
GENETIC TEST RESULT  HPI: Hayley Marks was previously seen in the [place] clinic due to a persoanl history of breast cancer, family history of breast, pancreatic, and other cancers and concerns regarding a hereditary predisposition to cancer. Please refer to our prior cancer genetics clinic note from October 09, 2016 for more information regarding Hayley Marks's medical, social and family histories, and our assessment and recommendations, at the time. Hayley Marks recent genetic test results were disclosed to her, as were recommendations warranted by these results. These results and recommendations are discussed in more detail below.  GENETIC TEST RESULTS: Genetic testing reported out on October 29, 2016 through the 42-gene [employer] Common Hereditary Cancers Panel (Breast, Gyn, GI) found no known deleterious mutations.  One variant of uncertain significance (VUS) called "c.1243T>C (p.Ser415PRo)" was found in one copy of the PDGFRA gene.  The 42-gene [facility] Common Hereditary Cancers Panel (Breast, Gyn, GI) performed by [facility], [place]) includes sequencing and/or deletion/duplication analysis for the following genes: APC, ATM, AXIN2, BARD1, BMPR1A, BRCA1, BRCA2, BRIP1, CDH1, CDKN2A, CHEK2, DICER1, EPCAM, GREM1, KIT, MEN1, MLH1, MSH2, MSH6, MUTYH, NBN, NF1, PALB2, PDGFRA, PMS2, POLD1, POLE, PTEN, RAD50, RAD51C, RAD51D, SDHA, SDHB, SDHC, SDHD, SMAD4, SMARCA4, STK11, TP53, TSC1, TSC2, and VHL.  The test report will be scanned into EPIC and will be located under the Molecular Pathology section of the Results Review tab.   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 At this time, it is unknown if this VUS is associated with an increased risk for cancer or if this is a normal finding. Since this VUS result is uncertain, it cannot help guide screening recommendations, and family members  should not be tested for this VUS to help define their own cancer risks.  Also, we all have variants within our genes that make [place] unique individuals--most of these variants are benign.  Thus, we treat this VUS as a negative result until it gets updated by the lab.  With time, we suspect the lab will reclassify this variant and when they do, we will try to re-contact Hayley Marks to discuss the reclassification further.  We also encouraged Hayley Marks to contact us in a year or two to obtain an update on the status of this VUS.  We discussed with Hayley Marks that since the current genetic testing is not perfect, it is possible there may be a gene mutation in one of these genes that current testing cannot detect, but that chance is small. We also discussed, that it is possible that another gene that has not yet been discovered, or that we have not yet tested, is responsible for the cancer diagnoses in the family, and it is, therefore, important to remain in touch with cancer genetics in the future so that we can continue to offer Hayley Marks the most up-to-date genetic testing.    CANCER SCREENING RECOMMENDATIONS: While we still do not have an explanation for the personal and family history of cancer, this result may be reassuring and indicate that Hayley Marks likely does not have an increased risk for a future cancer due to a mutation in one of these genes. This normal test also suggests that Hayley Marks's cancer was most likely not due to an inherited predisposition associated with one of these genes.  Most cancers happen by chance and this negative test suggests that her cancer falls into this category. Additionally, many of  the cancers in Hayley Marks's family were diagnosed at much older ages.  We, therefore, recommended she continue to follow the cancer management and screening guidelines provided by her oncology and primary healthcare providers.   RECOMMENDATIONS FOR FAMILY MEMBERS: Men and women in this family might  be at some increased risk of developing cancer, over the general population risk, simply due to the family history of cancer. We recommended women in this family have a yearly mammogram beginning at age 70, or 72 years younger than the earliest onset of cancer, an annual clinical breast exam, and perform monthly breast self-exams. Women in this family should also have a gynecological exam as recommended by their primary provider. All family members should have a colonoscopy by age 31.  FOLLOW-UP: Lastly, we discussed with Hayley Marks that cancer genetics is a rapidly advancing field and it is possible that new genetic tests will be appropriate for her and/or her family members in the future. We encouraged her to remain in contact with cancer genetics on an annual basis so we can update her personal and family histories and let her know of advances in cancer genetics that may benefit this family.   Our contact number was provided. Hayley Marks questions were answered to her satisfaction, and she knows she is welcome to call us at anytime with additional questions or concerns.   Jeanine Luz, MS, [facility] Certified Genetic Counselor [email or web address] Phone: [phone]

## 2016-11-29 ENCOUNTER — Other Ambulatory Visit: Payer: Self-pay | Admitting: Hematology

## 2016-11-29 DIAGNOSIS — Z170 Estrogen receptor positive status [ER+]: Principal | ICD-10-CM

## 2016-11-29 DIAGNOSIS — C50311 Malignant neoplasm of lower-inner quadrant of right female breast: Secondary | ICD-10-CM

## 2017-01-01 ENCOUNTER — Telehealth: Payer: Self-pay | Admitting: Adult Health

## 2017-01-01 NOTE — Telephone Encounter (Signed)
The Survivorship Care Plan was mailed to Ms. Essner as she reported not being able to come in to the Survivorship Clinic for an in-person visit at this time. A letter was mailed to her outlining the purpose of the content of the care plan, as well as encouraging her to reach out to me with any questions or concerns.  .  Additional resources regarding healthy eating, recommendations for exercise, LIVESTRONG program, and brochures for support services were also included in the mailed packet.    I will not be placing any follow-up appointments to the Survivorship Clinic for Ms. Kasai, but I am happy to see her at any time in the future for any survivorship concerns that may arise. Thank you for allowing me to participate in her care!  Charlestine Massed, NP [place] [phone]

## 2017-01-10 NOTE — H&P (Signed)
Subjective:     Patient ID: Hayley Marks is a 66 y.o. female.  HPI 5 months post op. Plan for right implant exchange and left mastopexy.   Presented following screening MMG with right breast calcifications 04/2016. Diagnostic MMG with calcifications LIQ right 3.8 x 3.3 x 2.6 cm. Biopsy with high grade DCIS, ER/PR+. MRI demonstrated enhancement over both LIQ, LOQ spanning 6.4 x 5.5 x 3.0 cm. Final pathology 0.6 cm IDC with DCIS high grade, DCIS focally present at posterior margin and broadly < 0.1 cm from posterior margin. SLN negative. On AI.  Prior 36 C, happy with this. Right mastectomy 281 g  Patient lives in [place], retired Therapist, sports.   Review of Systems  Allergic/Immunologic:       Reports allergy to UV light      Objective:   Physical Exam  Cardiovascular: Normal rate, regular rhythm and normal heart sounds.   Pulmonary/Chest: Effort normal and breath sounds normal.   Chest soft +scoliosis with right chest more prominent SN to nipple R 18 L 22 cm BW R 14 (chest wall 12 cm) L 16 Nipple to IMF R 6 L 8   Assessment:     IDC/DCIS right breast S/p NSM, TE reconstruction    Plan:       Plan implant exchange on right. Reviewed saline vs silicone, shaped vs round. Reviewed risk ALCL with textured devices, all anatomic are textured. Reviewed MRI surveillance with silicone implants, rate rupture, risk infection, contraction, and rippling with all type implants. Patient has elected for smooth saline. Plan left mastopexy. Discussed with unilateral implant reconstruction, goal should be to achieve similar cleavage in bra, volume however naked will always have less upper pole fullness, more ptosis on natural breast side. Reviewed anchor type scar on left. She reports nearly symmetric volume in bra at this time and anticipate largely skin excision on left.  Reviewed post procedure limitations, visits. Given the distance she lives, plan overnight stay.   Natrelle 133MX-12-T  400 ml expander placed,  fill volume 290 ml   Irene Limbo, MD [facility] Plastic & Reconstructive Surgery [phone], pin [phone]

## 2017-01-22 NOTE — Progress Notes (Addendum)
[place]  [phone] Fax:([phone]) [phone]  Clinic Follow up Note   Patient Care Team: Rolm Bookbinder, MD as Consulting Physician (General Surgery) Truitt Merle, MD as Consulting Physician (Hematology) Minette Headland, NP as Nurse Practitioner (Hematology and Oncology) 01/23/2017    CHIEF COMPLAINTS:  Follow up right breast cancer  Oncology History   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 female breast [facility])   Staging form: Breast, AJCC 7th Edition   - Clinical stage from 05/10/2016: Stage 0 (Tis (DCIS), N0, cM0(i+)) - Signed by Truitt Merle, MD on 08/22/2016   - Pathologic stage from 08/02/2016: Stage IA (T1b, N0, cM0) - Signed by Truitt Merle, MD on 08/22/2016      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 female breast ([place])   05/01/2016 Mammogram    Diagnostic mammo showed a group microcalcification in the medial lower right breast measuring 3.8X3.3X2.6cm       05/10/2016 Initial Diagnosis    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 female breast ([place])      05/10/2016 Initial Biopsy    Right breast biopsy showed DCIS      05/10/2016 Receptors her2    ER 95%+, PR 5%+      06/19/2016 Imaging    Breast MRI showed extensive non-mass enhancement involving the entire lower right breast, 6.4X5.5X3.0cm, no adenopathy, left breast (-)       08/02/2016 Surgery    Right mastectomy and SLN biopsy       08/02/2016 Pathology Results    Right mastectomy showed invasive ductal carcinoma, G1, and high grade DCIS, DCIS was focally positive at posterior margin and broadly less than 0.1cm from the same posterior margin, 1 SLN was negative, nipple biopsy was negative       08/02/2016 Receptors her2    ER 100% strongly +, PR-, HER2-, Ki67 5%       09/02/2016 -  Anti-estrogen oral therapy    Letrozole 2.'5mg'$  daily      11/01/2016 Genetic Testing    No known deleterious mutation was found.  One variant of uncertain significance (VUS) called "c.1243T>C  (p.Ser415PRo)" was found in one copy of the PDGFRA gene.  Genes tested: APC, ATM, AXIN2, BARD1, BMPR1A, BRCA1, BRCA2, BRIP1, CDH1, CDKN2A, CHEK2, DICER1, EPCAM, GREM1, KIT, MEN1, MLH1, MSH2, MSH6, MUTYH, NBN, NF1, PALB2, PDGFRA, PMS2, POLD1, POLE, PTEN, RAD50, RAD51C, RAD51D, SDHA, SDHB, SDHC, SDHD, SMAD4, SMARCA4, STK11, TP53, TSC1, TSC2, and VHL.         HISTORY OF PRESENTING ILLNESS:  Hayley Marks 66 y.o. female is here because of her right breast cancer. She presents to the clinic by herself today.  This was discovered by screening mammogram. She never had abnormal mammogram or biopsy before. She underwent diagnostic mammogram and ultrasound on 05/01/2016, which showed a group of calcification in the right breast, measuring 3.8 cm. Core needle biopsy showed DCIS. ER/PR positive. She underwent breast MRI on 06/19/2016, which showed extensive non-mass enhancement involving the entire lower right breast, measuring 6.4 cm. Dr. Donne Hazel recommended right mastectomy and sentinel lymph node biopsy, which was done on 08/02/2016. Final surgical path showed small focus of invasive ductal carcinoma, and high-grade DCIS.   She has been recovering well from surgery, pain is mild, she takes Tylenol as needed. No other complaints.  GYN HISTORY  Menarchal: 14 LMP: hysrectomy in 1993 Contraceptive: no  HRT: no  G4P2:   CURRENT THERAPY: letrozole 2.'5mg'$  daily, started on 09/06/2016  INTERIM HISTORY: Hoyle Sauer returns for  follow up. She reports she is doing well. She reports some bone pain at night, which moves around, and she attributes this to Letrozole. She also reports her nails have been very brittle recently, and it is bothersome. The patient reports she has an upcoming saline implant surgery scheduled.   MEDICAL HISTORY:  Past Medical History:  Diagnosis Date  . Asthma    as child  . Cancer ([place])    DCIS right breast  . GERD (gastroesophageal reflux disease)   . Scoliosis     SURGICAL  HISTORY: Past Surgical History:  Procedure Laterality Date  . ABDOMINAL HYSTERECTOMY  1993  . BREAST RECONSTRUCTION WITH PLACEMENT OF TISSUE EXPANDER AND FLEX HD (ACELLULAR HYDRATED DERMIS) Right 08/02/2016   Procedure: RIGHT BREAST RECONSTRUCTION WITH PLACEMENT OF TISSUE EXPANDER;  Surgeon: Irene Limbo, MD;  Location: [facility];  Service: Plastics;  Laterality: Right;  . NIPPLE SPARING MASTECTOMY/SENTINAL LYMPH NODE BIOPSY/RECONSTRUCTION/PLACEMENT OF TISSUE EXPANDER Right 08/02/2016   Procedure: RIGHT NIPPLE SPARING MASTECTOMY WITH SENTINAL LYMPH NODE BIOPSY;  Surgeon: Rolm Bookbinder, MD;  Location: [facility];  Service: General;  Laterality: Right;    SOCIAL HISTORY: Social History   Social History  . Marital status: Married    Spouse name: N/A  . Number of children: N/A  . Years of education: N/A   Occupational History  . Not on file.   Social History Main Topics  . Smoking status: Never Smoker  . Smokeless tobacco: Never Used  . Alcohol use Yes     Comment: social  . Drug use: No  . Sexual activity: Not on file   Other Topics Concern  . Not on file   Social History Narrative  . No narrative on file   She is divorced, she has 2 sons, 96 and 88 yo   She is a retired Marine scientist, she used to Hotel manager in college.   FAMILY HISTORY: Family History  Problem Relation Age of Onset  . Breast cancer Maternal Aunt     dx. 50-51y; d. 54-55y  . Colon cancer Maternal Grandmother 80    d. 63y  . Cancer Maternal Grandfather     gastric and pancreatic cancer; dx 82-83y; d. 83y  . Colon polyps Mother     "a few"  . Dementia Mother   . Diabetes Maternal Uncle   . Other Maternal Uncle     dx renal tumor, NOS in his 69s  . Heart attack Paternal Grandfather   . Congestive Heart Failure Paternal Grandfather   . Fibrocystic breast disease Cousin   . Other Cousin     maternal 1st cousins hx partial colon resection, maybe due to diverticulosis    . Heart attack Paternal Uncle     d. 72  . Diabetes Paternal Uncle   . Congestive Heart Failure Paternal Uncle     d. 75y  . Leukemia Cousin     paternal 1st cousin d. 44y  . Prostate cancer Cousin     paternal 1st cousin dx prostate cancer    ALLERGIES:  is allergic to penicillins and other.  MEDICATIONS:  Current Outpatient Prescriptions  Medication Sig Dispense Refill  . aspirin EC 81 MG tablet Take 81 mg by mouth daily.    Marland Kitchen CALCIUM-VITAMINS C & D PO Take 1 tablet by mouth. Calcium 600 + Vit D.    . cyclobenzaprine (FLEXERIL) 10 MG tablet Take 10 mg by mouth 3 (three) times daily as needed for muscle spasms.    Marland Kitchen  letrozole (FEMARA) 2.5 MG tablet TAKE 1 TABLET BY MOUTH DAILY 30 tablet 3  . Multiple Vitamin (MULTIVITAMIN WITH MINERALS) TABS tablet Take 1 tablet by mouth daily.    Marland Kitchen oxyCODONE (ROXICODONE) 5 MG immediate release tablet Take 1 tablet (5 mg total) by mouth every 4 (four) hours as needed for severe pain. (Patient not taking: Reported on 10/22/2016) 50 tablet 0  . ranitidine (ZANTAC) 150 MG tablet Take 150 mg by mouth 2 (two) times daily.     No current facility-administered medications for this visit.     REVIEW OF SYSTEMS:  Constitutional: Denies fevers, chills or abnormal night sweats Eyes: Denies blurriness of vision, double vision or watery eyes Ears, nose, mouth, throat, and face: Denies mucositis or sore throat Respiratory: Denies cough, dyspnea or wheezes Cardiovascular: Denies palpitation, chest discomfort or lower extremity swelling Gastrointestinal:  Denies nausea, heartburn or change in bowel habits Skin: Denies abnormal skin rashes Lymphatics: Denies new lymphadenopathy or easy bruising Musculoskeletal: (+) some bone pain attributed to Letrozole Neurological:Denies numbness, tingling or new weaknesses Behavioral/Psych: Mood is stable, no new changes  All other systems were reviewed with the patient and are negative.  PHYSICAL EXAMINATION: ECOG  PERFORMANCE STATUS: 0 - Asymptomatic  Vitals:   01/23/17 1211  BP: 135/62  Pulse: 71  Resp: 18  Temp: 99 F (37.2 C)   Filed Weights   01/23/17 1211  Weight: 142 lb (64.4 kg)    GENERAL:alert, no distress and comfortable SKIN: skin color, texture, turgor are normal, no rashes or significant lesions EYES: normal, conjunctiva are pink and non-injected, sclera clear OROPHARYNX:no exudate, no erythema and lips, buccal mucosa, and tongue normal  NECK: supple, thyroid normal size, non-tender, without nodularity LYMPH:  no palpable lymphadenopathy in the cervical, axillary or inguinal LUNGS: clear to auscultation and percussion with normal breathing effort HEART: regular rate & rhythm and no murmurs and no lower extremity edema ABDOMEN:abdomen soft, non-tender and normal bowel sounds Musculoskeletal:no cyanosis of digits and no clubbing  PSYCH: alert & oriented x 3 with fluent speech NEURO: no focal motor/sensory deficits Breasts: Breast inspection showed right breast is surgically absent, Tissue expander in place, incision scar has healed well. No skin erythema or discharge. Palpation of the left breasts and axilla revealed no obvious mass that I could appreciate.   LABORATORY DATA:  I have reviewed the data as listed CBC Latest Ref Rng & Units 01/23/2017 10/22/2016 08/23/2016  WBC 3.9 - 10.3 10e3/uL 4.3 5.1 5.4  Hemoglobin 11.6 - 15.9 g/dL 11.9 12.3 12.4  Hematocrit 34.8 - 46.6 % 35.7 38.1 36.7  Platelets 145 - 400 10e3/uL 248 258 311   CMP Latest Ref Rng & Units 01/23/2017 10/22/2016 08/23/2016  Glucose 70 - 140 mg/dl 81 88 82  BUN 7.0 - 26.0 mg/dL 16.0 14.6 13.5  Creatinine 0.6 - 1.1 mg/dL 0.9 0.9 1.0  Sodium 136 - 145 mEq/L 141 141 142  Potassium 3.5 - 5.1 mEq/L 3.8 4.2 3.9  CO2 22 - 29 mEq/L _0 Calcium 8.4 - 10.4 mg/dL 9.7 9.7 9.6  Total Protein 6.4 - 8.3 g/dL 7.4 7.6 8.0  Total Bilirubin 0.20 - 1.20 mg/dL 0.43 0.33 0.39  Alkaline Phos 40 - 150 U/L 91 88 95  AST 5 -  34 U/L _1 ALT 0 - 55 U/L _2 PATHOLOGY REPORT  Diagnosis 08/02/2016 1. Breast, simple mastectomy, Right - INVASIVE DUCTAL CARCINOMA, GRADE I/III, SPANNING 0.6 CM. - DUCTAL  CARCINOMA IN SITU WITH CALCIFICATIONS, HIGH GRADE. - DUCTAL CARCINOMA IN SITU IS FOCALLY PRESENT AT THE POSTERIOR MARGIN AND BROADLY LESS THAN 0.1 CM FROM THE SAME POSTERIOR MARGIN. - SEE ONCOLOGY TABLE BELOW. 2. Nipple Biopsy, Right - BENIGN BREAST PARENCHYMA. - THERE IS NO EVIDENCE OF MALIGNANCY. 3. Lymph node, sentinel, biopsy, Right Axillary - THERE IS NO EVIDENCE OF CARCINOMA OF 1 OF 1 LYMPH NODE (0/1).  Diagnosis 05/10/2016 Breast, right, needle core biopsy, LIQ - DUCTAL CARCINOMA IN SITU WITH CALCIFICATIONS. - SEE COMMENT.  Genetic Testing 10/29/16 Negative for known pathogenic mutations within any of 42 genes on the [employer] Common Hereditary Cancers Panel (Breast, Gyn, GI).  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  The 42-gene [facility] Common Hereditary Cancers Panel (Breast, Gyn, GI) performed by [facility] [place]) includes sequencing and/or deletion/duplication analysis for the following genes: APC, ATM, AXIN2, BARD1, BMPR1A, BRCA1, BRCA2, BRIP1, CDH1, CDKN2A, CHEK2, DICER1, EPCAM, GREM1, KIT, MEN1, MLH1, MSH2, MSH6, MUTYH, NBN, NF1, PALB2, PDGFRA, PMS2, POLD1, POLE, PTEN, RAD50, RAD51C, RAD51D, SDHA, SDHB, SDHC, SDHD, SMAD4, SMARCA4, STK11, TP53, TSC1, TSC2, and VHL.  Date of report is October 29, 2016.   RADIOGRAPHIC STUDIES: I have personally reviewed the radiological images as listed and agreed with the findings in the report. No results found. MR breast b/l w wo contrast 06/19/2016 IMPRESSION: 1. Extensive linear non mass enhancement involving the entire lower right breast, including the lower inner quadrant and the lower outer quadrant, spanning approximately 6.4 x 5.5 x 3.0 cm,  including the area of biopsy proven DCIS. Extensive DCIS is suspected. This area of enhancement is much larger than the calcifications identified on recent mammography. 2. No pathologic lymphadenopathy. Multiple normal-appearing right axillary lymph nodes. 3. No MRI evidence of malignancy involving the left breast.  MM diagnostic R 05/01/2016 FINDINGS: Spot magnification CC and lateral views of the right breast are      submitted. There is a group of indeterminate microcalcifications in the medial lower right breast measuring 3.8 x 3.3 x 2.6 cm.  IMPRESSION: Suspicious findings.  RECOMMENDATION: Stereotactic core biopsy right breast calcifications   ASSESSMENT & PLAN: 66 y.o. Caucasian female, presented with screening discovered a right breast DCIS.  1. 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 -Her initial biopsy only showed DCIS, but the final surgical path showed small invasive ductal carcinoma, grade 1, 0.6 cm, and high-grade DCIS. -Giving the small size of the invasive tumor, low-grade, strongly ER positivity, I do not think she needs adjuvant chemotherapy, and we'll not order Oncotype. -We previously discussed the risk of recurrence after surgery. She does have positive posterior margin for DCIS, but Dr. Donne Hazel does not think he can resect more margins. Her case was discussed in breast tumor conference earlier this week, adjuvant radiation was not recommended. -She has started adjuvant letrozole, tolerating well, we'll continue for 5 years -We'll continue breast cancer surveillance with annual screening mammogram, self-exam, routine office visit and exam. -She is clinically doing very well, exam is unremarkable, lab reviewed, her CBC  and CMP are normal. No clinical concern of recurrence. -The patient will continue to follow up with me, will change her visit to every 6 months form now on,  due to her low risk for recurrence   2. Genetics -She is positive family history of breast cancer and multiple other malignancies, she is interested in genetics. -Genetic results from 10/29/16 were negative.  3. Bone health  -We discussed that aromatase inhibitor may weak her bone -I recommend her to have a baseline bone density scan, and follow-up every 2 years -The patient's recent bone pain could be attributed to Letrozole. She may take Ibuprofen for pain relief. -I encouraged the patient to exercise as able to improve overall health and reduce discomfort. -The patient may take Biotin for hair, skin, and nail health as needed. -I will try to obtain most recent bone density scan records from [place] in [place].  Plan -Patient is due for routine mammogram in 3 months, I ordered for her today. -I will see her back in 6 months. -I will try to obtain bone density scan records from [place] in [place]. -Continue letrozole  No orders of the defined types were placed in this encounter.   All questions were answered. The patient knows to call the clinic with any problems, questions or concerns. I spent 15 minutes counseling the patient face to face. The total time spent in the appointment was 20 minutes and more than 50% was on counseling.  This document serves as a record of services personally performed by Truitt Merle, MD. It was created on her behalf by Maryla Morrow, a trained medical scribe. The creation of this record is based on the scribe's personal observations and the provider's statements to them. This document has been checked and approved by the attending provider.    Truitt Merle, MD 01/23/2017   Addendum I received a her bone density scan from 09/04/2016 which showed osteopenia. T score of -1.6 of the left forearm  Truitt Merle  05/13/2017

## 2017-01-23 ENCOUNTER — Encounter: Payer: Self-pay | Admitting: Hematology

## 2017-01-23 ENCOUNTER — Telehealth: Payer: Self-pay | Admitting: Hematology

## 2017-01-23 ENCOUNTER — Other Ambulatory Visit (HOSPITAL_BASED_OUTPATIENT_CLINIC_OR_DEPARTMENT_OTHER): Payer: BLUE CROSS/BLUE SHIELD

## 2017-01-23 ENCOUNTER — Ambulatory Visit (HOSPITAL_BASED_OUTPATIENT_CLINIC_OR_DEPARTMENT_OTHER): Payer: BLUE CROSS/BLUE SHIELD | Admitting: Hematology

## 2017-01-23 VITALS — BP 135/62 | HR 71 | Temp 99.0°F | Resp 18 | Ht 60.0 in | Wt 142.0 lb

## 2017-01-23 DIAGNOSIS — Z170 Estrogen receptor positive status [ER+]: Secondary | ICD-10-CM

## 2017-01-23 DIAGNOSIS — C50311 Malignant neoplasm of lower-inner quadrant of right female breast: Secondary | ICD-10-CM

## 2017-01-23 DIAGNOSIS — Z79811 Long term (current) use of aromatase inhibitors: Secondary | ICD-10-CM

## 2017-01-23 LAB — COMPREHENSIVE METABOLIC PANEL
ALT: 16 U/L (ref 0–55)
AST: 18 U/L (ref 5–34)
Albumin: 3.9 g/dL (ref 3.5–5.0)
Alkaline Phosphatase: 91 U/L (ref 40–150)
Anion Gap: 9 mEq/L (ref 3–11)
BUN: 16 mg/dL (ref 7.0–26.0)
CALCIUM: 9.7 mg/dL (ref 8.4–10.4)
CHLORIDE: 107 meq/L (ref 98–109)
CO2: 25 mEq/L (ref 22–29)
CREATININE: 0.9 mg/dL (ref 0.6–1.1)
EGFR: 78 mL/min/{1.73_m2} — ABNORMAL LOW (ref >90)
Glucose: 81 mg/dl (ref 70–140)
Potassium: 3.8 mEq/L (ref 3.5–5.1)
Sodium: 141 mEq/L (ref 136–145)
Total Bilirubin: 0.43 mg/dL (ref 0.20–1.20)
Total Protein: 7.4 g/dL (ref 6.4–8.3)

## 2017-01-23 LAB — CBC WITH DIFFERENTIAL/PLATELET
BASO%: 0.3 % (ref 0.0–2.0)
Basophils Absolute: 0 10*3/uL (ref 0.0–0.1)
EOS%: 1.7 % (ref 0.0–7.0)
Eosinophils Absolute: 0.1 10*3/uL (ref 0.0–0.5)
HEMATOCRIT: 35.7 % (ref 34.8–46.6)
HGB: 11.9 g/dL (ref 11.6–15.9)
LYMPH#: 1.7 10*3/uL (ref 0.9–3.3)
LYMPH%: 40.1 % (ref 14.0–49.7)
MCH: 28.3 pg (ref 25.1–34.0)
MCHC: 33.4 g/dL (ref 31.5–36.0)
MCV: 84.8 fL (ref 79.5–101.0)
MONO#: 0.4 10*3/uL (ref 0.1–0.9)
MONO%: 9.5 % (ref 0.0–14.0)
NEUT#: 2.1 10*3/uL (ref 1.5–6.5)
NEUT%: 48.4 % (ref 38.4–76.8)
Platelets: 248 10*3/uL (ref 145–400)
RBC: 4.2 10*6/uL (ref 3.70–5.45)
RDW: 14.2 % (ref 11.2–14.5)
WBC: 4.3 10*3/uL (ref 3.9–10.3)

## 2017-01-23 NOTE — Telephone Encounter (Signed)
Gave patient avs report and appointments for August. Patient will arrange annual mammo for May - no order entered.

## 2017-01-24 ENCOUNTER — Encounter (HOSPITAL_BASED_OUTPATIENT_CLINIC_OR_DEPARTMENT_OTHER): Payer: Self-pay | Admitting: *Deleted

## 2017-01-29 ENCOUNTER — Encounter (HOSPITAL_BASED_OUTPATIENT_CLINIC_OR_DEPARTMENT_OTHER): Payer: Self-pay | Admitting: Anesthesiology

## 2017-01-29 ENCOUNTER — Ambulatory Visit (HOSPITAL_BASED_OUTPATIENT_CLINIC_OR_DEPARTMENT_OTHER): Payer: BLUE CROSS/BLUE SHIELD | Admitting: Anesthesiology

## 2017-01-29 ENCOUNTER — Ambulatory Visit (HOSPITAL_BASED_OUTPATIENT_CLINIC_OR_DEPARTMENT_OTHER)
Admission: RE | Admit: 2017-01-29 | Discharge: 2017-01-30 | Disposition: A | Discharge status: Home / Self Care | Payer: BLUE CROSS/BLUE SHIELD | Source: Ambulatory Visit | Attending: Plastic Surgery | Admitting: Plastic Surgery

## 2017-01-29 ENCOUNTER — Encounter (HOSPITAL_BASED_OUTPATIENT_CLINIC_OR_DEPARTMENT_OTHER)
Admission: RE | Disposition: A | Discharge status: Home / Self Care | Payer: Self-pay | Source: Ambulatory Visit | Attending: Plastic Surgery

## 2017-01-29 DIAGNOSIS — N651 Disproportion of reconstructed breast: Secondary | ICD-10-CM | POA: Diagnosis not present

## 2017-01-29 DIAGNOSIS — Z853 Personal history of malignant neoplasm of breast: Secondary | ICD-10-CM | POA: Diagnosis not present

## 2017-01-29 DIAGNOSIS — Z9011 Acquired absence of right breast and nipple: Secondary | ICD-10-CM | POA: Insufficient documentation

## 2017-01-29 DIAGNOSIS — L905 Scar conditions and fibrosis of skin: Secondary | ICD-10-CM | POA: Diagnosis not present

## 2017-01-29 HISTORY — PX: REMOVAL OF TISSUE EXPANDER AND PLACEMENT OF IMPLANT: SHX6457

## 2017-01-29 HISTORY — PX: AUGMENTATION MAMMAPLASTY: SUR837

## 2017-01-29 HISTORY — PX: MASTOPEXY: SHX5358

## 2017-01-29 LAB — POCT I-STAT, CHEM 8
BUN: 15 mg/dL (ref 6–20)
CALCIUM ION: 1.18 mmol/L (ref 1.15–1.40)
Chloride: 103 mmol/L (ref 101–111)
Creatinine, Ser: 0.8 mg/dL (ref 0.44–1.00)
GLUCOSE: 153 mg/dL — AB (ref 65–99)
HCT: 34 % — ABNORMAL LOW (ref 36.0–46.0)
Hemoglobin: 11.6 g/dL — ABNORMAL LOW (ref 12.0–15.0)
Potassium: 4 mmol/L (ref 3.5–5.1)
SODIUM: 140 mmol/L (ref 135–145)
TCO2: 24 mmol/L (ref 0–100)

## 2017-01-29 LAB — POCT HEMOGLOBIN-HEMACUE: HEMOGLOBIN: 10.7 g/dL — AB (ref 12.0–15.0)

## 2017-01-29 SURGERY — REMOVAL, TISSUE EXPANDER, BREAST, WITH IMPLANT INSERTION
Anesthesia: General | Site: Breast | Laterality: Right

## 2017-01-29 MED ORDER — ACETAMINOPHEN 500 MG PO TABS
1000.0000 mg | ORAL_TABLET | ORAL | Status: AC
  Administered 2017-01-29: 1000 mg via ORAL

## 2017-01-29 MED ORDER — LIDOCAINE-EPINEPHRINE 2 %-1:100000 IJ SOLN
INTRAMUSCULAR | Status: AC
  Filled 2017-01-29: qty 6.8

## 2017-01-29 MED ORDER — OXYCODONE HCL 5 MG PO TABS: 5.0000 mg | ORAL_TABLET | ORAL | 0 refills | Status: DC | PRN

## 2017-01-29 MED ORDER — ACETAMINOPHEN 500 MG PO TABS
ORAL_TABLET | ORAL | Status: AC
  Filled 2017-01-29: qty 2

## 2017-01-29 MED ORDER — SUGAMMADEX SODIUM 200 MG/2ML IV SOLN
INTRAVENOUS | Status: DC | PRN
  Administered 2017-01-29: 128 mg via INTRAVENOUS

## 2017-01-29 MED ORDER — SULFAMETHOXAZOLE-TRIMETHOPRIM 800-160 MG PO TABS: 1.0000 | ORAL_TABLET | Freq: Two times a day (BID) | ORAL | 0 refills | Status: DC

## 2017-01-29 MED ORDER — HYDROMORPHONE HCL 1 MG/ML IJ SOLN: 0.5000 mg | INTRAMUSCULAR | Status: DC | PRN

## 2017-01-29 MED ORDER — ONDANSETRON 4 MG PO TBDP: 4.0000 mg | ORAL_TABLET | Freq: Four times a day (QID) | ORAL | Status: DC | PRN

## 2017-01-29 MED ORDER — SODIUM CHLORIDE 0.9 % IJ SOLN
INTRAMUSCULAR | Status: AC
  Filled 2017-01-29: qty 20

## 2017-01-29 MED ORDER — GABAPENTIN 300 MG PO CAPS
ORAL_CAPSULE | ORAL | Status: AC
  Filled 2017-01-29: qty 1

## 2017-01-29 MED ORDER — SODIUM CHLORIDE 0.9 % IV SOLN
INTRAVENOUS | Status: DC | PRN
  Administered 2017-01-29: 1000 mL

## 2017-01-29 MED ORDER — MIDAZOLAM HCL 5 MG/5ML IJ SOLN
INTRAMUSCULAR | Status: DC | PRN
  Administered 2017-01-29: 2 mg via INTRAVENOUS

## 2017-01-29 MED ORDER — CELECOXIB 200 MG PO CAPS
ORAL_CAPSULE | ORAL | Status: AC
  Filled 2017-01-29: qty 2

## 2017-01-29 MED ORDER — MIDAZOLAM HCL 2 MG/2ML IJ SOLN
INTRAMUSCULAR | Status: AC
  Filled 2017-01-29: qty 2

## 2017-01-29 MED ORDER — FENTANYL CITRATE (PF) 100 MCG/2ML IJ SOLN: 50.0000 ug | INTRAMUSCULAR | Status: DC | PRN

## 2017-01-29 MED ORDER — ONDANSETRON HCL 4 MG/2ML IJ SOLN
INTRAMUSCULAR | Status: DC | PRN
  Administered 2017-01-29: 4 mg via INTRAVENOUS

## 2017-01-29 MED ORDER — PROPOFOL 10 MG/ML IV BOLUS
INTRAVENOUS | Status: AC
  Filled 2017-01-29: qty 40

## 2017-01-29 MED ORDER — CLINDAMYCIN PHOSPHATE 600 MG/50ML IV SOLN
INTRAVENOUS | Status: AC
  Filled 2017-01-29: qty 50

## 2017-01-29 MED ORDER — MIDAZOLAM HCL 2 MG/2ML IJ SOLN: 1.0000 mg | INTRAMUSCULAR | Status: DC | PRN

## 2017-01-29 MED ORDER — LACTATED RINGERS IV SOLN
INTRAVENOUS | Status: DC
  Administered 2017-01-29 (×2): via INTRAVENOUS

## 2017-01-29 MED ORDER — FENTANYL CITRATE (PF) 100 MCG/2ML IJ SOLN
INTRAMUSCULAR | Status: DC | PRN
  Administered 2017-01-29: 100 ug via INTRAVENOUS
  Administered 2017-01-29 (×2): 50 ug via INTRAVENOUS

## 2017-01-29 MED ORDER — ONDANSETRON HCL 4 MG/2ML IJ SOLN
INTRAMUSCULAR | Status: AC
  Filled 2017-01-29: qty 2

## 2017-01-29 MED ORDER — CLINDAMYCIN PHOSPHATE 600 MG/50ML IV SOLN
600.0000 mg | Freq: Once | INTRAVENOUS | Status: AC
  Administered 2017-01-29: 600 mg via INTRAVENOUS

## 2017-01-29 MED ORDER — ONDANSETRON HCL 4 MG/2ML IJ SOLN: 4.0000 mg | Freq: Four times a day (QID) | INTRAMUSCULAR | Status: DC | PRN

## 2017-01-29 MED ORDER — FENTANYL CITRATE (PF) 100 MCG/2ML IJ SOLN
INTRAMUSCULAR | Status: AC
  Filled 2017-01-29: qty 4

## 2017-01-29 MED ORDER — CHLORHEXIDINE GLUCONATE CLOTH 2 % EX PADS: 6.0000 | MEDICATED_PAD | Freq: Once | CUTANEOUS | Status: DC

## 2017-01-29 MED ORDER — DEXAMETHASONE SODIUM PHOSPHATE 10 MG/ML IJ SOLN
INTRAMUSCULAR | Status: AC
  Filled 2017-01-29: qty 1

## 2017-01-29 MED ORDER — BUPIVACAINE-EPINEPHRINE (PF) 0.5% -1:200000 IJ SOLN
INTRAMUSCULAR | Status: AC
  Filled 2017-01-29: qty 30

## 2017-01-29 MED ORDER — CYCLOBENZAPRINE HCL 10 MG PO TABS: 10.0000 mg | ORAL_TABLET | Freq: Three times a day (TID) | ORAL | Status: DC | PRN

## 2017-01-29 MED ORDER — CELECOXIB 400 MG PO CAPS
400.0000 mg | ORAL_CAPSULE | ORAL | Status: AC
  Administered 2017-01-29: 400 mg via ORAL

## 2017-01-29 MED ORDER — FAMOTIDINE 20 MG PO TABS
20.0000 mg | ORAL_TABLET | Freq: Two times a day (BID) | ORAL | Status: DC
  Administered 2017-01-29: 20 mg via ORAL

## 2017-01-29 MED ORDER — TOBRAMYCIN-DEXAMETHASONE 0.3-0.1 % OP OINT
TOPICAL_OINTMENT | OPHTHALMIC | Status: AC
  Filled 2017-01-29: qty 3.5

## 2017-01-29 MED ORDER — HYDROMORPHONE HCL 1 MG/ML IJ SOLN
INTRAMUSCULAR | Status: AC
  Filled 2017-01-29: qty 1

## 2017-01-29 MED ORDER — OXYCODONE HCL 5 MG PO TABS
5.0000 mg | ORAL_TABLET | ORAL | Status: DC | PRN
  Administered 2017-01-29 (×2): 5 mg via ORAL
  Administered 2017-01-30: 10 mg via ORAL
  Filled 2017-01-29 (×2): qty 1
  Filled 2017-01-29: qty 2

## 2017-01-29 MED ORDER — LIDOCAINE 2% (20 MG/ML) 5 ML SYRINGE
INTRAMUSCULAR | Status: AC
  Filled 2017-01-29: qty 10

## 2017-01-29 MED ORDER — CLINDAMYCIN PHOSPHATE 600 MG/50ML IV SOLN
600.0000 mg | Freq: Three times a day (TID) | INTRAVENOUS | Status: AC
  Administered 2017-01-29 – 2017-01-30 (×2): 600 mg via INTRAVENOUS

## 2017-01-29 MED ORDER — KCL IN DEXTROSE-NACL 20-5-0.45 MEQ/L-%-% IV SOLN
INTRAVENOUS | Status: DC
  Administered 2017-01-29: 11:00:00 via INTRAVENOUS
  Filled 2017-01-29: qty 1000

## 2017-01-29 MED ORDER — ROCURONIUM BROMIDE 100 MG/10ML IV SOLN
INTRAVENOUS | Status: DC | PRN
  Administered 2017-01-29: 40 mg via INTRAVENOUS

## 2017-01-29 MED ORDER — ROCURONIUM BROMIDE 50 MG/5ML IV SOSY
PREFILLED_SYRINGE | INTRAVENOUS | Status: AC
  Filled 2017-01-29: qty 5

## 2017-01-29 MED ORDER — LIDOCAINE HCL (CARDIAC) 20 MG/ML IV SOLN
INTRAVENOUS | Status: DC | PRN
  Administered 2017-01-29: 30 mg via INTRAVENOUS
  Administered 2017-01-29 (×2): 20 mg via INTRAVENOUS
  Administered 2017-01-29: 50 mg via INTRAVENOUS

## 2017-01-29 MED ORDER — DEXAMETHASONE SODIUM PHOSPHATE 4 MG/ML IJ SOLN
INTRAMUSCULAR | Status: DC | PRN
  Administered 2017-01-29: 10 mg via INTRAVENOUS

## 2017-01-29 MED ORDER — SCOPOLAMINE 1 MG/3DAYS TD PT72: 1.0000 | MEDICATED_PATCH | Freq: Once | TRANSDERMAL | Status: DC | PRN

## 2017-01-29 MED ORDER — LIDOCAINE 2% (20 MG/ML) 5 ML SYRINGE
INTRAMUSCULAR | Status: AC
  Filled 2017-01-29: qty 5

## 2017-01-29 MED ORDER — HYDROMORPHONE HCL 1 MG/ML IJ SOLN
0.2500 mg | INTRAMUSCULAR | Status: DC | PRN
  Administered 2017-01-29 (×4): 0.5 mg via INTRAVENOUS

## 2017-01-29 MED ORDER — GABAPENTIN 300 MG PO CAPS
300.0000 mg | ORAL_CAPSULE | ORAL | Status: AC
  Administered 2017-01-29: 300 mg via ORAL

## 2017-01-29 SURGICAL SUPPLY — 70 items
BAG DECANTER FOR FLEXI CONT (MISCELLANEOUS) ×4 IMPLANT
BINDER BREAST LRG (GAUZE/BANDAGES/DRESSINGS) ×2 IMPLANT
BINDER BREAST MEDIUM (GAUZE/BANDAGES/DRESSINGS) IMPLANT
BINDER BREAST XLRG (GAUZE/BANDAGES/DRESSINGS) IMPLANT
BINDER BREAST XXLRG (GAUZE/BANDAGES/DRESSINGS) IMPLANT
BLADE SURG 10 STRL SS (BLADE) ×7 IMPLANT
BLADE SURG 15 STRL LF DISP TIS (BLADE) IMPLANT
BLADE SURG 15 STRL SS (BLADE)
BNDG GAUZE ELAST 4 BULKY (GAUZE/BANDAGES/DRESSINGS) ×8 IMPLANT
CANISTER SUCT 1200ML W/VALVE (MISCELLANEOUS) ×4 IMPLANT
CHLORAPREP W/TINT 26ML (MISCELLANEOUS) ×6 IMPLANT
CLOSURE WOUND 1/2 X4 (GAUZE/BANDAGES/DRESSINGS)
COVER MAYO STAND STRL (DRAPES) ×4 IMPLANT
DECANTER SPIKE VIAL GLASS SM (MISCELLANEOUS) IMPLANT
DRAIN CHANNEL 15F RND FF W/TCR (WOUND CARE) IMPLANT
DRAIN CHANNEL 19F RND (DRAIN) IMPLANT
DRAPE TOP ARMCOVERS (MISCELLANEOUS) ×4 IMPLANT
DRAPE U-SHAPE 76X120 STRL (DRAPES) ×4 IMPLANT
DRSG PAD ABDOMINAL 8X10 ST (GAUZE/BANDAGES/DRESSINGS) ×8 IMPLANT
DRSG TEGADERM 2-3/8X2-3/4 SM (GAUZE/BANDAGES/DRESSINGS) IMPLANT
ELECT BLADE 4.0 EZ CLEAN MEGAD (MISCELLANEOUS) ×4
ELECT COATED BLADE 2.86 ST (ELECTRODE) ×4 IMPLANT
ELECT REM PT RETURN 9FT ADLT (ELECTROSURGICAL) ×4
ELECTRODE BLDE 4.0 EZ CLN MEGD (MISCELLANEOUS) ×2 IMPLANT
ELECTRODE REM PT RTRN 9FT ADLT (ELECTROSURGICAL) ×2 IMPLANT
EVACUATOR SILICONE 100CC (DRAIN) IMPLANT
GAUZE SPONGE 4X4 12PLY STRL (GAUZE/BANDAGES/DRESSINGS) IMPLANT
GLOVE BIO SURGEON STRL SZ 6 (GLOVE) ×10 IMPLANT
GLOVE BIOGEL PI IND STRL 7.0 (GLOVE) IMPLANT
GLOVE BIOGEL PI INDICATOR 7.0 (GLOVE) ×4
GLOVE ECLIPSE 6.5 STRL STRAW (GLOVE) ×3 IMPLANT
GOWN STRL REUS W/ TWL LRG LVL3 (GOWN DISPOSABLE) ×4 IMPLANT
GOWN STRL REUS W/TWL LRG LVL3 (GOWN DISPOSABLE) ×12
IMPL BREAST P4.1-4.4XMDRT 300 (Breast) ×1 IMPLANT
IMPL BRST P4.1-4.4XMDRT 300CC (Breast) ×2 IMPLANT
IMPLANT BREAST SALINE 300CC (Breast) ×4 IMPLANT
IV NS 500ML (IV SOLUTION) ×4
IV NS 500ML BAXH (IV SOLUTION) ×2 IMPLANT
KIT FILL SYSTEM UNIVERSAL (SET/KITS/TRAYS/PACK) IMPLANT
LIQUID BAND (GAUZE/BANDAGES/DRESSINGS) ×8 IMPLANT
NDL HYPO 25X1 1.5 SAFETY (NEEDLE) IMPLANT
NEEDLE HYPO 25X1 1.5 SAFETY (NEEDLE) IMPLANT
NS IRRIG 1000ML POUR BTL (IV SOLUTION) ×4 IMPLANT
PACK BASIN DAY SURGERY FS (CUSTOM PROCEDURE TRAY) ×4 IMPLANT
PENCIL BUTTON HOLSTER BLD 10FT (ELECTRODE) ×4 IMPLANT
PIN SAFETY STERILE (MISCELLANEOUS) IMPLANT
SHEET MEDIUM DRAPE 40X70 STRL (DRAPES) ×5 IMPLANT
SIZER BREAST SGL USE 300CC (SIZER) ×4
SIZER BRST SGL USE 300CC (SIZER) IMPLANT
SLEEVE SCD COMPRESS KNEE MED (MISCELLANEOUS) ×4 IMPLANT
SPONGE GAUZE 4X4 12PLY STER LF (GAUZE/BANDAGES/DRESSINGS) IMPLANT
SPONGE LAP 18X18 X RAY DECT (DISPOSABLE) ×11 IMPLANT
STAPLER VISISTAT 35W (STAPLE) ×7 IMPLANT
STRIP CLOSURE SKIN 1/2X4 (GAUZE/BANDAGES/DRESSINGS) IMPLANT
SUT ETHILON 2 0 FS 18 (SUTURE) IMPLANT
SUT MNCRL AB 4-0 PS2 18 (SUTURE) ×4 IMPLANT
SUT PDS AB 2-0 CT2 27 (SUTURE) IMPLANT
SUT VIC AB 3-0 PS1 18 (SUTURE) ×8
SUT VIC AB 3-0 PS1 18XBRD (SUTURE) ×2 IMPLANT
SUT VIC AB 3-0 SH 27 (SUTURE) ×4
SUT VIC AB 3-0 SH 27X BRD (SUTURE) ×2 IMPLANT
SUT VICRYL 4-0 PS2 18IN ABS (SUTURE) ×10 IMPLANT
SYR BULB IRRIGATION 50ML (SYRINGE) ×8 IMPLANT
SYR CONTROL 10ML LL (SYRINGE) IMPLANT
TAPE MEASURE VINYL STERILE (MISCELLANEOUS) ×4 IMPLANT
TOWEL OR 17X24 6PK STRL BLUE (TOWEL DISPOSABLE) ×8 IMPLANT
TUBE CONNECTING 20'X1/4 (TUBING) ×2
TUBE CONNECTING 20X1/4 (TUBING) ×6 IMPLANT
UNDERPAD 30X30 (UNDERPADS AND DIAPERS) ×8 IMPLANT
YANKAUER SUCT BULB TIP NO VENT (SUCTIONS) ×4 IMPLANT

## 2017-01-29 NOTE — Op Note (Signed)
Operative Note   DATE OF OPERATION: 2.27.18  LOCATION: [facility]-observation  SURGICAL DIVISION: Plastic Surgery  PREOPERATIVE DIAGNOSES:  1. History right breast cancer 2. Acquired absence breast 3. Asymmetry native and reconstructed breast  POSTOPERATIVE DIAGNOSES:  same  PROCEDURE:  1. Removal right tissue expander and placement saline implant 2. Left mastopexy  SURGEON: Irene Limbo MD MBA  ASSISTANT: none  ANESTHESIA:  General.   EBL: 50 ml  COMPLICATIONS: None immediate.   INDICATIONS FOR PROCEDURE:  The patient, Hayley Marks, is a 66 y.o. female born on January 01, 1951, is here for second stage breast reconstruction following right nipple sparing mastectomy and immediate expander reconstruction.   FINDINGS: Skin only resected from left breast. Right chest, Natrelle Smooth Round Moderate Profile Saline 300 ml implant placed, fill volume 330 ml. REF [id number], SN [id number]  DESCRIPTION OF PROCEDURE:  The patient's operative site was marked with the patient in the preoperative area to mark chest midline, sternal notch, desired anterior axillary line. Over left breast, location of nipple and cephalic edge of nipple areolar complex marked symemtric with right. 9 cm vertical limbs marked symmetric with nipple to inframammary fold on right. The patientwas taken to the operating room. SCDs were placed and IV antibiotics were given. The patient's operative site was prepped and draped in a sterile fashion. A time out was performed and all information was confirmed to be correct.Incision made in right inframammary fold mastectomy scar and carried to pectoralis muscle and capsule.Expander removed. Scoring of capsule performed over lower pole. Popcorn capsulorraphy performed over anterior axillary line.  Sizer placed.  In supine position the inframammary fold was marked over left breast. The transverse limbs then marked. The NAC was marked with 38 mm cookie cutter. The patient  was tailor tacked and brought to upright sitting position and assessed for symmetry. A Natrelle Moderate Profile 300 ml implant selected for right chest. Patient returned to supine position. Over left breast, the area marked by tailor tacking was de epithelialized and skin flaps developed for redraping, maintaining a central pedicle. The breast cavity irrigated and hemostasis obtained. Closure completed with interrupted 3-0 vicryl in dermis for closure of vertical and transverse limbs. The NAC was inset with 4-0 vicryl in dermis. Skin closure completed with 4-0 moncryl.  At this time right breast cavity irrigated with solution containing Ancef, gentamicin, and bacitracin, followed by Betadine. Implant placed in left breast cavity and filled to 330 ml. Fill tubing removed and care taken to ensure proper orientation implant, closure fill tab. Closure was completed with running 3-0 vicryl for approximation of capsule and pectoralis muscle. Additional 3-0 vicryl placed in superficial fascia. 4-0 vicryl was placed in dermis and running 4-0 monocryl was used to close skin.   Tissue adhesive applied to breast incisions.Dry dressing and breast binder applied.  The patient was allowed to wake from anesthesia, extubated and taken to the recovery room in satisfactory condition.   SPECIMENS: left mastopexy  DRAINS: none  Irene Limbo, MD [facility] Plastic & Reconstructive Surgery [phone], pin [phone]

## 2017-01-29 NOTE — Transfer of Care (Signed)
Immediate Anesthesia Transfer of Care Note  Patient: Hayley Marks  Procedure(s) Performed: Procedure(s): REMOVAL OF TISSUE EXPANDER AND PLACEMENT OF IMPLANT (Right) MASTOPEXY (Left)  Patient Location: PACU  Anesthesia Type:General  Level of Consciousness: awake, alert  and oriented  Airway & Oxygen Therapy: Patient Spontanous Breathing and Patient connected to face mask oxygen  Post-op Assessment: Report given to RN and Post -op Vital signs reviewed and stable  Post vital signs: Reviewed and stable  Last Vitals:  Vitals:   01/29/17 0645 01/29/17 0958  BP: 135/77 139/90  Pulse: 70   Resp: 16   Temp: 36.7 C     Last Pain:  Vitals:   01/29/17 0645  TempSrc: Oral  PainSc: 0-No pain         Complications: No apparent anesthesia complications

## 2017-01-29 NOTE — Anesthesia Procedure Notes (Signed)
Procedure Name: Intubation Date/Time: 01/29/2017 7:36 AM Performed by: Rayvon Char Pre-anesthesia Checklist: Patient identified, Emergency Drugs available, Suction available and Patient being monitored Patient Re-evaluated:Patient Re-evaluated prior to inductionOxygen Delivery Method: Circle system utilized Preoxygenation: Pre-oxygenation with 100% oxygen Intubation Type: IV induction Ventilation: Mask ventilation without difficulty Laryngoscope Size: Miller and 2 Grade View: Grade I Tube type: Oral Number of attempts: 1 Airway Equipment and Method: Stylet Placement Confirmation: ETT inserted through vocal cords under direct vision,  positive ETCO2 and breath sounds checked- equal and bilateral Secured at: 22 cm Dental Injury: Teeth and Oropharynx as per pre-operative assessment

## 2017-01-29 NOTE — Anesthesia Preprocedure Evaluation (Addendum)
Anesthesia Evaluation  Patient identified by MRN, date of birth, ID band Patient awake    Reviewed: Allergy & Precautions  Airway Mallampati: II  TM Distance: >3 FB     Dental   Pulmonary asthma ,    breath sounds clear to auscultation       Cardiovascular negative cardio ROS   Rhythm:Regular Rate:Normal     Neuro/Psych    GI/Hepatic Neg liver ROS, GERD  ,  Endo/Other  negative endocrine ROS  Renal/GU negative Renal ROS     Musculoskeletal   Abdominal   Peds  Hematology   Anesthesia Other Findings   Reproductive/Obstetrics                             Anesthesia Physical Anesthesia Plan  ASA: III  Anesthesia Plan: General   Post-op Pain Management:    Induction: Intravenous  Airway Management Planned: Oral ETT  Additional Equipment:   Intra-op Plan:   Post-operative Plan: Extubation in OR  Informed Consent: I have reviewed the patients History and Physical, chart, labs and discussed the procedure including the risks, benefits and alternatives for the proposed anesthesia with the patient or authorized representative who has indicated his/her understanding and acceptance.   Dental advisory given  Plan Discussed with: CRNA and Anesthesiologist  Anesthesia Plan Comments:         Anesthesia Quick Evaluation

## 2017-01-29 NOTE — Anesthesia Postprocedure Evaluation (Addendum)
Anesthesia Post Note  Patient: Hayley Marks  Procedure(s) Performed: Procedure(s) (LRB): REMOVAL OF TISSUE EXPANDER AND PLACEMENT OF IMPLANT (Right) MASTOPEXY (Left)  Patient location during evaluation: PACU Anesthesia Type: General Level of consciousness: awake Pain management: pain level controlled Vital Signs Assessment: post-procedure vital signs reviewed and stable Respiratory status: spontaneous breathing Cardiovascular status: stable Anesthetic complications: no Comments: EKG and labs (K+) reviewed. WNL  Crissie Sickles. Smith Robert, MD, MBA Anesthesiology         Last Vitals:  Vitals:   01/29/17 0958 01/29/17 1000  BP: 139/90 (!) 131/98  Pulse:  81  Resp:    Temp:  36.4 C    Last Pain:  Vitals:   01/29/17 1000  TempSrc:   PainSc: 7                  CHARLENE GREEN

## 2017-01-29 NOTE — Interval H&P Note (Signed)
History and Physical Interval Note:  01/29/2017 7:00 AM  Hayley Marks  has presented today for surgery, with the diagnosis of hx DCIS aquired absent breast asymmetry navie and recontstuctive breast  The various methods of treatment have been discussed with the patient and family. After consideration of risks, benefits and other options for treatment, the patient has consented to  Procedure(s): REMOVAL OF TISSUE EXPANDER AND PLACEMENT OF IMPLANT (Right) MASTOPEXY (Left) as a surgical intervention .  The patient's history has been reviewed, patient examined, no change in status, stable for surgery.  I have reviewed the patient's chart and labs.  Questions were answered to the patient's satisfaction.     Garon Melander

## 2017-01-30 ENCOUNTER — Encounter (HOSPITAL_BASED_OUTPATIENT_CLINIC_OR_DEPARTMENT_OTHER): Payer: Self-pay | Admitting: Plastic Surgery

## 2017-01-30 DIAGNOSIS — N651 Disproportion of reconstructed breast: Secondary | ICD-10-CM | POA: Diagnosis not present

## 2017-02-01 ENCOUNTER — Ambulatory Visit: Payer: BLUE CROSS/BLUE SHIELD | Admitting: Cardiovascular Disease

## 2017-02-07 ENCOUNTER — Ambulatory Visit (INDEPENDENT_AMBULATORY_CARE_PROVIDER_SITE_OTHER): Payer: BLUE CROSS/BLUE SHIELD | Admitting: Cardiology

## 2017-02-07 ENCOUNTER — Encounter: Payer: Self-pay | Admitting: Cardiology

## 2017-02-07 VITALS — BP 130/90 | HR 78 | Ht 60.0 in | Wt 138.0 lb

## 2017-02-07 DIAGNOSIS — R9431 Abnormal electrocardiogram [ECG] [EKG]: Secondary | ICD-10-CM | POA: Diagnosis not present

## 2017-02-07 DIAGNOSIS — I493 Ventricular premature depolarization: Secondary | ICD-10-CM | POA: Insufficient documentation

## 2017-02-07 NOTE — Patient Instructions (Signed)
Medication Instructions:  Your physician recommends that you continue on your current medications as directed. Please refer to the Current Medication list given to you today.  Labwork: None   Testing/Procedures: Your physician has requested that you have an echocardiogram. Echocardiography is a painless test that uses sound waves to create images of your heart. It provides your doctor with information about the size and shape of your heart and how well your heart's chambers and valves are working. This procedure takes approximately one hour. There are no restrictions for this procedure.  Test performed at [facility] [facility]: Your physician recommends that you schedule a follow-up appointment in: FOLLOW UP AFTER ECHO SCHEUDLE ECHO ON THE SAME DAY AS THE FOLLOW UP APPOINTMENT WITH DR [place]--[place] PATIENT LIVES 2 HOURS AWAY AND BOTH APPOINTMENTS MUST BE SCHEDULED ON THE SAME DAY  Any Other Special Instructions Will Be Listed Below (If Applicable).  If you need a refill on your cardiac medications before your next appointment, please call your pharmacy.

## 2017-02-07 NOTE — Assessment & Plan Note (Signed)
"  Possible anterior infarct"

## 2017-02-07 NOTE — Progress Notes (Signed)
02/07/2017 Hayley Marks   11/13/1951  [id number]  Primary Physician No primary care provider on file. Primary Cardiologist: Dr Lucretia Field (new)  HPI:  66 y/o AA female with a history of breast cancer referred to cardiology for (presumably) an abnormal EKG, and PVCs and back pain in recovery. 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 The pt has no history of MI or CAD. She is active and has no history of DOE, palpitations, or chest pain. She remembers post op she had back pain they told her she had PVCs and bigeminy. Her pre op EKG was read by the computer as "possible anterior MI". She has no history of DM, HTN, HLD, or FMHx of CAD.    Current Outpatient Prescriptions  Medication Sig Dispense Refill  . aspirin EC 81 MG tablet Take 81 mg by mouth daily.    Marland Kitchen CALCIUM-VITAMINS C & D PO Take 1 tablet by mouth. Calcium 600 + Vit D.    . cyclobenzaprine (FLEXERIL) 10 MG tablet Take 10 mg by mouth 3 (three) times daily as needed for muscle spasms.    Marland Kitchen letrozole (FEMARA) 2.5 MG tablet TAKE 1 TABLET BY MOUTH DAILY 30 tablet 3  . Multiple Vitamin (MULTIVITAMIN WITH MINERALS) TABS tablet Take 1 tablet by mouth daily.    Marland Kitchen oxyCODONE (ROXICODONE) 5 MG immediate release tablet Take 1 tablet (5 mg total) by mouth every 4 (four) hours as needed for severe pain. 30 tablet 0  . ranitidine (ZANTAC) 150 MG tablet Take 150 mg by mouth 2 (two) times daily.     No current facility-administered medications for this visit.     Allergies  Allergen Reactions  . Other Rash    Exposure to sunlight, even on cloudy days will result in a rash/sunburn  . Penicillins Other (See Comments)    Childhood allergy ? allergy    Past Medical History:  Diagnosis Date  . Asthma    as child  . 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 GERD (gastroesophageal reflux disease)   . Scoliosis     Social History   Social History  . Marital status: Married    Spouse  name: N/A  . Number of children: N/A  . Years of education: N/A   Occupational History  . Not on file.   Social History Main Topics  . Smoking status: Never Smoker  . Smokeless tobacco: Never Used  . Alcohol use Yes     Comment: social  . Drug use: No  . Sexual activity: Not on file   Other Topics Concern  . Not on file   Social History Narrative  . No narrative on file     Family History  Problem Relation Age of Onset  . Breast cancer Maternal Aunt     dx. 50-51y; d. 54-55y  . Colon cancer Maternal Grandmother 80    d. 69y  . Cancer Maternal Grandfather     gastric and pancreatic cancer; dx 82-83y; d. 83y  . Colon polyps Mother     "a few"  . Dementia Mother   . Diabetes Maternal Uncle   . Other Maternal Uncle     dx renal tumor, NOS in his 67s  . Heart attack Paternal Grandfather   . Congestive Heart Failure Paternal Grandfather   . Fibrocystic breast disease Cousin   . Other Cousin     maternal 1st cousins  hx partial colon resection, maybe due to diverticulosis  . Heart attack Paternal Uncle     d. 61  . Diabetes Paternal Uncle   . Congestive Heart Failure Paternal Uncle     d. 75y  . Leukemia Cousin     paternal 1st cousin d. 75y  . Prostate cancer Cousin     paternal 1st cousin dx prostate cancer     Review of Systems: General: negative for chills, fever, night sweats or weight changes.  Cardiovascular: negative for chest pain, dyspnea on exertion, edema, orthopnea, palpitations, paroxysmal nocturnal dyspnea or shortness of breath Dermatological: negative for rash Respiratory: negative for cough or wheezing Urologic: negative for hematuria Abdominal: negative for nausea, vomiting, diarrhea, bright red blood per rectum, melena, or hematemesis Neurologic: negative for visual changes, syncope, or dizziness All other systems reviewed and are otherwise negative except as noted above.    Blood pressure 130/90, pulse 78, height 5' (1.524 m), weight 138  lb (62.6 kg).  General appearance: alert, cooperative and no distress Neck: no carotid bruit, no JVD, supple, symmetrical, trachea midline and thyroid not enlarged, symmetric, no tenderness/mass/nodules Lungs: clear to auscultation bilaterally Heart: regular rate and rhythm Abdomen: soft, non-tender; bowel sounds normal; no masses,  no organomegaly Extremities: extremities normal, atraumatic, no cyanosis or edema Pulses: 2+ and symmetric Skin: Skin color, texture, turgor normal. No rashes or lesions Neurologic: Grossly normal  EKG 02/07/17- NSR  ASSESSMENT AND PLAN:   Abnormal EKG "Possible anterior infarct"  Frequent PVCs Noted during breast surgery  History of breast cancer S/P surgery 01/29/17   PLAN  The pt lives in [place] hours away. I will order an echo to check LVF and r/o WMA and a f/u with Dr Oval Linsey on the same day.   [name] PA-C 02/07/2017 10:42 AM

## 2017-02-07 NOTE — Assessment & Plan Note (Signed)
Noted during breast surgery

## 2017-02-07 NOTE — Assessment & Plan Note (Signed)
S/P surgery 01/29/17

## 2017-03-14 ENCOUNTER — Other Ambulatory Visit: Payer: Self-pay

## 2017-03-14 ENCOUNTER — Ambulatory Visit (INDEPENDENT_AMBULATORY_CARE_PROVIDER_SITE_OTHER): Payer: BLUE CROSS/BLUE SHIELD | Admitting: Cardiovascular Disease

## 2017-03-14 ENCOUNTER — Encounter: Payer: Self-pay | Admitting: Cardiovascular Disease

## 2017-03-14 ENCOUNTER — Ambulatory Visit (HOSPITAL_COMMUNITY): Payer: BLUE CROSS/BLUE SHIELD | Attending: Internal Medicine

## 2017-03-14 VITALS — BP 130/80 | HR 73 | Ht 60.0 in | Wt 140.6 lb

## 2017-03-14 DIAGNOSIS — I493 Ventricular premature depolarization: Secondary | ICD-10-CM

## 2017-03-14 DIAGNOSIS — R9431 Abnormal electrocardiogram [ECG] [EKG]: Secondary | ICD-10-CM | POA: Diagnosis not present

## 2017-03-14 DIAGNOSIS — Z9013 Acquired absence of bilateral breasts and nipples: Secondary | ICD-10-CM | POA: Insufficient documentation

## 2017-03-14 DIAGNOSIS — Z853 Personal history of malignant neoplasm of breast: Secondary | ICD-10-CM | POA: Insufficient documentation

## 2017-03-14 DIAGNOSIS — Z8249 Family history of ischemic heart disease and other diseases of the circulatory system: Secondary | ICD-10-CM | POA: Insufficient documentation

## 2017-03-14 NOTE — Progress Notes (Signed)
Cardiology Office Note   Date:  03/14/2017   ID:  Hayley Marks, DOB 08/30/1951, MRN [id number]  PCP:  Lester Palmyra, MD  Cardiologist:   Skeet Latch, MD   No chief complaint on file.     History of Present Illness: Hayley Marks is a 66 y.o. female with breast cancer who presents for follow-up.  She was noted to have PVCs following breast surgery. During surgery she had an atrial arrhythmia and several PVCs.  Post operative EKG was unremarkale. She saw [name] 02/07/17 due to PVCs and was referred for an echocardiogram 03/14/17 that revealed LVEF 60-65% with grade 1 diastolic dysfunction.  [name] has been feeling well.  She denies chest pain or shortness of breath.  She doesn't exercise regularly but is active.  She hasn't noted any palpitations, lower extremity edema, orthopnea or PND. At the time of her surgery her potassium was 4.0.  She was very mildly anemic.   Past Medical History:  Diagnosis Date  . Asthma    as child  . 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 GERD (gastroesophageal reflux disease)   . Scoliosis     Past Surgical History:  Procedure Laterality Date  . ABDOMINAL HYSTERECTOMY  1993  . BREAST RECONSTRUCTION WITH PLACEMENT OF TISSUE EXPANDER AND FLEX HD (ACELLULAR HYDRATED DERMIS) Right 08/02/2016   Procedure: RIGHT BREAST RECONSTRUCTION WITH PLACEMENT OF TISSUE EXPANDER;  Surgeon: Irene Limbo, MD;  Location: [facility];  Service: Plastics;  Laterality: Right;  . MASTOPEXY Left 01/29/2017   Procedure: MASTOPEXY;  Surgeon: Irene Limbo, MD;  Location: [facility];  Service: Plastics;  Laterality: Left;  . NIPPLE SPARING MASTECTOMY/SENTINAL LYMPH NODE BIOPSY/RECONSTRUCTION/PLACEMENT OF TISSUE EXPANDER Right 08/02/2016   Procedure: RIGHT NIPPLE SPARING MASTECTOMY WITH SENTINAL LYMPH NODE BIOPSY;  Surgeon: Rolm Bookbinder, MD;  Location: [facility];  Service: General;  Laterality: Right;  . REMOVAL  OF TISSUE EXPANDER AND PLACEMENT OF IMPLANT Right 01/29/2017   Procedure: REMOVAL OF TISSUE EXPANDER AND PLACEMENT OF IMPLANT;  Surgeon: Irene Limbo, MD;  Location: [facility];  Service: Plastics;  Laterality: Right;     Current Outpatient Prescriptions  Medication Sig Dispense Refill  . aspirin EC 81 MG tablet Take 81 mg by mouth daily.    . cyclobenzaprine (FLEXERIL) 10 MG tablet Take 10 mg by mouth 3 (three) times daily as needed for muscle spasms.    Marland Kitchen letrozole (FEMARA) 2.5 MG tablet TAKE 1 TABLET BY MOUTH DAILY 30 tablet 3  . Multiple Vitamin (MULTIVITAMIN WITH MINERALS) TABS tablet Take 1 tablet by mouth daily.    . ranitidine (ZANTAC) 150 MG tablet Take 150 mg by mouth 2 (two) times daily.     No current facility-administered medications for this visit.     Allergies:   Other and Penicillins    Social History:  The patient  reports that she has never smoked. She has never used smokeless tobacco. She reports that she drinks alcohol. She reports that she does not use drugs.   Family History:  The patient's family history includes Breast cancer in her maternal aunt; Cancer in her maternal grandfather; Colon cancer (age of onset: 53) in her maternal grandmother; Colon polyps in her mother; Congestive Heart Failure in her paternal grandfather and paternal uncle; Dementia in her mother; Diabetes in her maternal uncle and paternal uncle; Fibrocystic breast disease in her cousin; Heart attack in her paternal grandfather and paternal uncle;  Leukemia in her cousin; Other in her cousin and maternal uncle; Prostate cancer in her cousin.    ROS:  Please see the history of present illness.   Otherwise, review of systems are positive for none.   All other systems are reviewed and negative.    PHYSICAL EXAM: VS:  BP 130/80   Pulse 73   Ht 5' (1.524 m)   Wt 63.8 kg (140 lb 9.6 oz)   BMI 27.46 kg/m  , BMI Body mass index is 27.46 kg/m. GENERAL:  Well appearing HEENT:   Pupils equal round and reactive, fundi not visualized, oral mucosa unremarkable NECK:  No jugular venous distention, waveform within normal limits, carotid upstroke brisk and symmetric, no bruits LYMPHATICS:  No cervical adenopathy LUNGS:  Clear to auscultation bilaterally HEART:  RRR.  PMI not displaced or sustained,S1 and S2 within normal limits, no S3, no S4, no clicks, no rubs, no murmurs ABD:  Flat, positive bowel sounds normal in frequency in pitch, no bruits, no rebound, no guarding, no midline pulsatile mass, no hepatomegaly, no splenomegaly EXT:  2 plus pulses throughout, no edema, no cyanosis no clubbing SKIN:  No rashes no nodules NEURO:  Cranial nerves II through XII grossly intact, motor grossly intact throughout PSYCH:  Cognitively intact, oriented to person place and time    EKG:  EKG is not ordered today. The ekg ordered 02/08/17 demonstrates sinus rhythm rate 90 bpm.  Echo 03/14/17: Study Conclusions  - Left ventricle: The cavity size was normal. Systolic function was   normal. The estimated ejection fraction was in the range of 60%   to 65%. Wall motion was normal; there were no regional wall   motion abnormalities. Doppler parameters are consistent with   abnormal left ventricular relaxation (grade 1 diastolic   dysfunction). Doppler parameters are consistent with   indeterminate ventricular filling pressure. - Aortic valve: Transvalvular velocity was within the normal range.   There was no stenosis. There was no regurgitation. - Mitral valve: Transvalvular velocity was within the normal range.   There was no evidence for stenosis. There was trivial   regurgitation. - Right ventricle: The cavity size was normal. Wall thickness was   normal. Systolic function was normal. - Atrial septum: No defect or patent foramen ovale was identified   by color flow Doppler. - Tricuspid valve: There was trivial regurgitation. - Pulmonary arteries: Systolic pressure was within the  normal   range. PA peak pressure: 17 mm Hg (S).  Recent Labs: 01/23/2017: ALT 16; Platelets 248 01/29/2017: BUN 15; Creatinine, Ser 0.80; Hemoglobin 11.6; Potassium 4.0; Sodium 140    Lipid Panel No results found for: CHOL, TRIG, HDL, CHOLHDL, VLDL, LDLCALC, LDLDIRECT    Wt Readings from Last 3 Encounters:  03/14/17 63.8 kg (140 lb 9.6 oz)  02/07/17 62.6 kg (138 lb)  01/29/17 64 kg (141 lb)      ASSESSMENT AND PLAN:  # Intra-operative arrhythmia: Ms. Nobrega was noted to have PVCs and an atrial arrhythmia in the OR.  Post operative EKG and subsequent EKGs have been unremarkable.  She is completely asymptomatic.  Echo was unremarkable.  No additional testing required.  # CV Disease Prevention: Ms. Kirsh was encouraged to increase her exercise to at least 30-40 minutes most days of the week.  We will check fasting lipids.   Current medicines are reviewed at length with the patient today.  The patient does not have concerns regarding medicines.  The following changes have been made:  no  change  Labs/ tests ordered today include:  No orders of the defined types were placed in this encounter.    Disposition:   FU with Kerrington Sova C. Oval Linsey, MD, [facility] as needed.    This note was written with the assistance of speech recognition software.  Please excuse any transcriptional errors.  Signed, Emmily Pellegrin C. Oval Linsey, MD, [facility]  03/14/2017 11:08 AM    [place]

## 2017-03-14 NOTE — Patient Instructions (Addendum)
Medication Instructions:  .Your physician recommends that you continue on your current medications as directed. Please refer to the Current Medication list given to you today.  Labwork: none  Testing/Procedures: none  Follow-Up: As needed   

## 2017-03-26 ENCOUNTER — Other Ambulatory Visit: Payer: Self-pay | Admitting: Hematology

## 2017-03-26 DIAGNOSIS — C50311 Malignant neoplasm of lower-inner quadrant of right female breast: Secondary | ICD-10-CM

## 2017-03-26 DIAGNOSIS — Z170 Estrogen receptor positive status [ER+]: Principal | ICD-10-CM

## 2017-03-27 ENCOUNTER — Telehealth: Payer: Self-pay | Admitting: *Deleted

## 2017-03-27 NOTE — Telephone Encounter (Signed)
Left message to call back  

## 2017-03-27 NOTE — Telephone Encounter (Signed)
-----   Message from Skeet Latch, MD sent at 03/24/2017  9:59 PM EDT ----- Echo shows that her heart squeezes well but does not relax completely.  This is a mild change and will not cause symptoms unless it worsens.  It will be important to keep her blood pressure under good control.

## 2017-04-10 NOTE — Telephone Encounter (Signed)
New message    Pt is calling back about echo results.

## 2017-04-10 NOTE — Telephone Encounter (Signed)
[id number] 

## 2017-04-11 NOTE — Telephone Encounter (Signed)
Notes recorded by Cristopher Estimable, RN on 04/11/2017 at 8:37 AM EDT pt aware of results

## 2017-05-02 ENCOUNTER — Ambulatory Visit
Admission: RE | Admit: 2017-05-02 | Discharge: 2017-05-02 | Disposition: A | Discharge status: Home / Self Care | Payer: BLUE CROSS/BLUE SHIELD | Source: Ambulatory Visit | Attending: Hematology | Admitting: Hematology

## 2017-05-02 ENCOUNTER — Other Ambulatory Visit: Payer: Self-pay | Admitting: Hematology

## 2017-05-02 DIAGNOSIS — C50311 Malignant neoplasm of lower-inner quadrant of right female breast: Secondary | ICD-10-CM

## 2017-05-02 DIAGNOSIS — Z170 Estrogen receptor positive status [ER+]: Principal | ICD-10-CM

## 2017-05-06 ENCOUNTER — Telehealth: Payer: Self-pay | Admitting: Hematology

## 2017-05-06 IMAGING — MG DIGITAL DIAGNOSTIC UNILATERAL RIGHT MAMMOGRAM
2 series · 2 of 2 positions shown · non-contrast
Comparison: Previous exam(s).

CLINICAL DATA: Post stereotactic biopsy of right breast lower inner
quadrant calcifications.

EXAM:
DIAGNOSTIC RIGHT MAMMOGRAM POST STEREOTACTIC BIOPSY

[R CC]
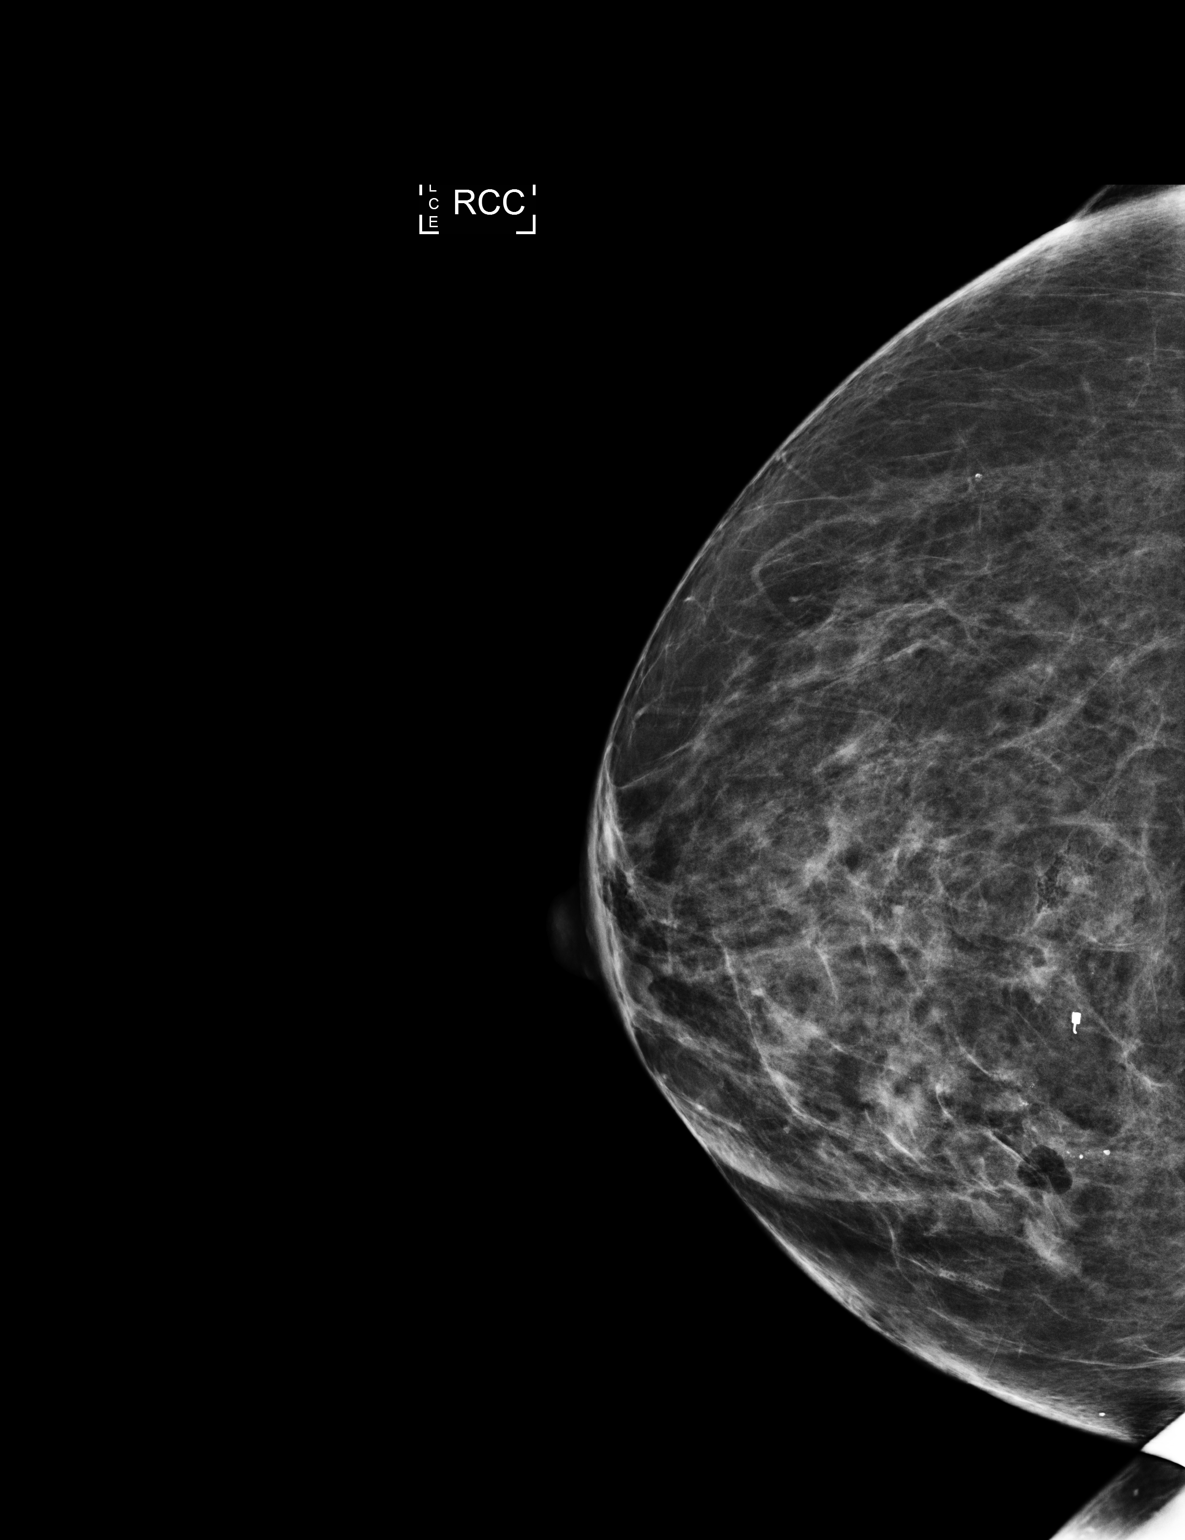

[R ML]
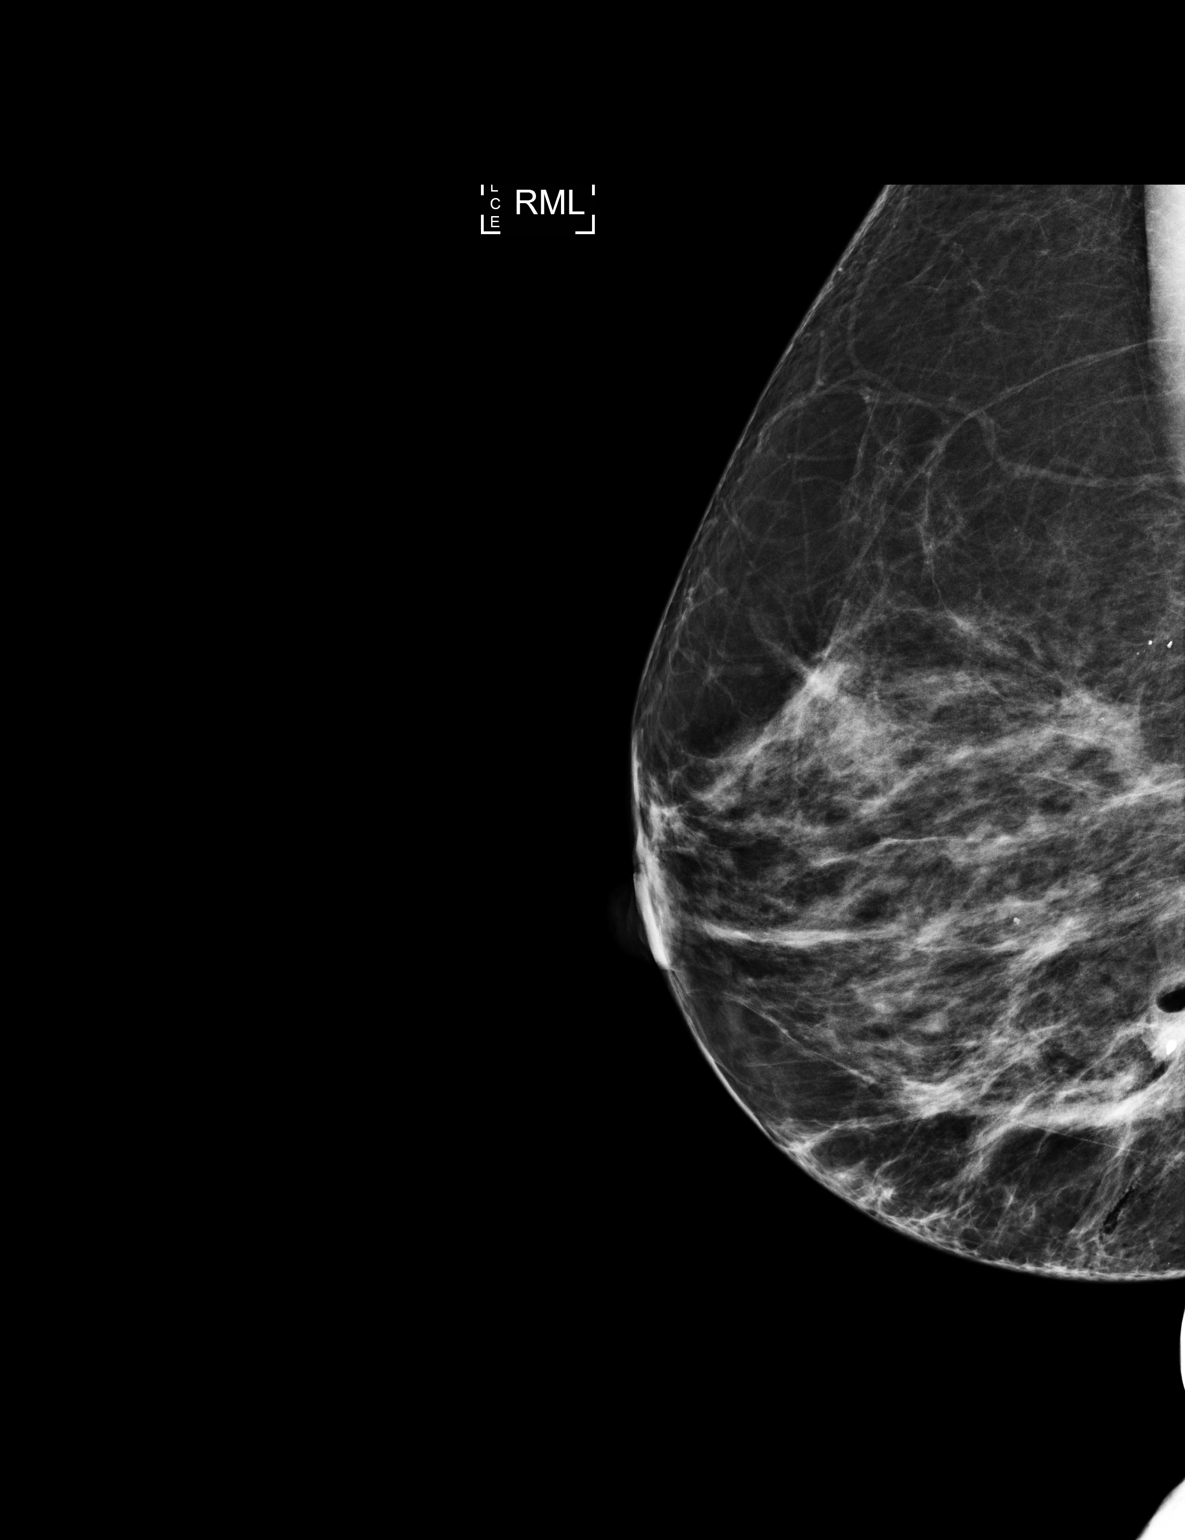

[2 of 2 positions shown; findings below may reference images not displayed]

FINDINGS: Mammographic images were obtained following stereotactic guided
biopsy of loose group of right breast lower inner quadrant
calcifications. Two-view mammography demonstrates presence of coil
shaped post biopsy marker 18 mm lateral to the the biopsy site.
Expected post biopsy changes are seen.
IMPRESSION: Tissue marker placement post stereotactic core needle biopsy of the
right breast.

The marker is noted to be 18 mm lateral to the biopsy site.

Final Assessment: Post Procedure Mammograms for Marker Placement

## 2017-05-06 NOTE — Telephone Encounter (Signed)
4

## 2017-05-16 ENCOUNTER — Encounter (HOSPITAL_COMMUNITY): Payer: Self-pay

## 2017-05-23 ENCOUNTER — Other Ambulatory Visit: Payer: Self-pay | Admitting: Hematology

## 2017-05-23 DIAGNOSIS — Z170 Estrogen receptor positive status [ER+]: Principal | ICD-10-CM

## 2017-05-23 DIAGNOSIS — C50311 Malignant neoplasm of lower-inner quadrant of right female breast: Secondary | ICD-10-CM

## 2017-07-25 ENCOUNTER — Other Ambulatory Visit: Payer: Self-pay | Admitting: Hematology

## 2017-07-25 DIAGNOSIS — Z170 Estrogen receptor positive status [ER+]: Principal | ICD-10-CM

## 2017-07-25 DIAGNOSIS — C50311 Malignant neoplasm of lower-inner quadrant of right female breast: Secondary | ICD-10-CM

## 2017-07-31 NOTE — Progress Notes (Addendum)
[place]  [phone] [phone]  Clinic Follow up Note   Patient Care Team: Lester La[place] Park, MD as PCP - General (Family Medicine) Rolm Bookbinder, MD as Consulting Physician (General Surgery) Truitt Merle, MD as Consulting Physician (Hematology) Gardenia Phlegm, NP as Nurse Practitioner (Hematology and Oncology) 08/01/2017    CHIEF COMPLAINTS:  Follow up right breast cancer  Oncology History   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 female breast [facility])   Staging form: Breast, AJCC 7th Edition   - Clinical stage from 05/10/2016: Stage 0 (Tis (DCIS), N0, cM0(i+)) - Signed by Truitt Merle, MD on 08/22/2016   - Pathologic stage from 08/02/2016: Stage IA (T1b, N0, cM0) - Signed by Truitt Merle, MD on 08/22/2016      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 female breast ([place])   05/01/2016 Mammogram    Diagnostic mammo showed a group microcalcification in the medial lower right breast measuring 3.8X3.3X2.6cm       05/10/2016 Initial Diagnosis    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 female breast ([place])      05/10/2016 Initial Biopsy    Right breast biopsy showed DCIS      05/10/2016 Receptors her2    ER 95%+, PR 5%+      06/19/2016 Imaging    Breast MRI showed extensive non-mass enhancement involving the entire lower right breast, 6.4X5.5X3.0cm, no adenopathy, left breast (-)       08/02/2016 Surgery    Right mastectomy and SLN biopsy       08/02/2016 Pathology Results    Right mastectomy showed invasive ductal carcinoma, G1, and high grade DCIS, DCIS was focally positive at posterior margin and broadly less than 0.1cm from the same posterior margin, 1 SLN was negative, nipple biopsy was negative       08/02/2016 Receptors her2    ER 100% strongly +, PR-, HER2-, Ki67 5%       09/02/2016 -  Anti-estrogen oral therapy    Letrozole 2.'5mg'$  daily      11/01/2016 Genetic Testing    No known deleterious mutation was  found.  One variant of uncertain significance (VUS) called "c.1243T>C (p.Ser415PRo)" was found in one copy of the PDGFRA gene.  Genes tested: APC, ATM, AXIN2, BARD1, BMPR1A, BRCA1, BRCA2, BRIP1, CDH1, CDKN2A, CHEK2, DICER1, EPCAM, GREM1, KIT, MEN1, MLH1, MSH2, MSH6, MUTYH, NBN, NF1, PALB2, PDGFRA, PMS2, POLD1, POLE, PTEN, RAD50, RAD51C, RAD51D, SDHA, SDHB, SDHC, SDHD, SMAD4, SMARCA4, STK11, TP53, TSC1, TSC2, and VHL.         HISTORY OF PRESENTING ILLNESS:  Hayley Marks 66 y.o. female is here because of her right breast cancer. She presents to the clinic by herself today.  This was discovered by screening mammogram. She never had abnormal mammogram or biopsy before. She underwent diagnostic mammogram and ultrasound on 05/01/2016, which showed a group of calcification in the right breast, measuring 3.8 cm. Core needle biopsy showed DCIS. ER/PR positive. She underwent breast MRI on 06/19/2016, which showed extensive non-mass enhancement involving the entire lower right breast, measuring 6.4 cm. Dr. Donne Hazel recommended right mastectomy and sentinel lymph node biopsy, which was done on 08/02/2016. Final surgical path showed small focus of invasive ductal carcinoma, and high-grade DCIS.   She has been recovering well from surgery, pain is mild, she takes Tylenol as needed. No other complaints.  GYN HISTORY  Menarchal: 14 LMP: hysrectomy in 1993 Contraceptive: no  HRT: no  G4P2:   CURRENT THERAPY: letrozole 2.'5mg'$   daily, started on 09/06/2016  INTERIM HISTORY:  Hayley Marks returns for follow up. She presents to the clinci today with her husband. She reports no troubles with letrozole. She sometimes do get hot but it is not bothersome. She gets some joint aches but not sure where it stems from. This is tolerable. She has no other concerns. She is not taking anything for iron but she does take a multivitamin. She reports to doing her self breast exams at home.  She sees her gynecologist once in year and  last was in May.  She is taking multivitamin with calcium and Vitamin D.  She wants to know if the clinic knows a get insurance provider and plans.    MEDICAL HISTORY:  Past Medical History:  Diagnosis Date  . Asthma    as child  . Cancer ([place])    DCIS right breast  . GERD (gastroesophageal reflux disease)   . Scoliosis     SURGICAL HISTORY: Past Surgical History:  Procedure Laterality Date  . ABDOMINAL HYSTERECTOMY  1993  . AUGMENTATION MAMMAPLASTY Right 01/29/2017   Saline  . BREAST BIOPSY Right 05/10/2016   Stereo- Malignant  . BREAST RECONSTRUCTION WITH PLACEMENT OF TISSUE EXPANDER AND FLEX HD (ACELLULAR HYDRATED DERMIS) Right 08/02/2016   Procedure: RIGHT BREAST RECONSTRUCTION WITH PLACEMENT OF TISSUE EXPANDER;  Surgeon: Irene Limbo, MD;  Location: [place];  Service: Plastics;  Laterality: Right;  . MASTECTOMY Right 08/02/2016  . MASTOPEXY Left 01/29/2017   Procedure: MASTOPEXY;  Surgeon: Irene Limbo, MD;  Location: [place];  Service: Plastics;  Laterality: Left;  . NIPPLE SPARING MASTECTOMY/SENTINAL LYMPH NODE BIOPSY/RECONSTRUCTION/PLACEMENT OF TISSUE EXPANDER Right 08/02/2016   Procedure: RIGHT NIPPLE SPARING MASTECTOMY WITH SENTINAL LYMPH NODE BIOPSY;  Surgeon: Rolm Bookbinder, MD;  Location: [place];  Service: General;  Laterality: Right;  . REDUCTION MAMMAPLASTY Left   . REMOVAL OF TISSUE EXPANDER AND PLACEMENT OF IMPLANT Right 01/29/2017   Procedure: REMOVAL OF TISSUE EXPANDER AND PLACEMENT OF IMPLANT;  Surgeon: Irene Limbo, MD;  Location: [facility];  Service: Plastics;  Laterality: Right;    SOCIAL HISTORY: Social History   Social History  . Marital status: Married    Spouse name: N/A  . Number of children: N/A  . Years of education: N/A   Occupational History  . Not on file.   Social History Main Topics  . Smoking status: Never Smoker  . Smokeless tobacco: Never Used  .  Alcohol use Yes     Comment: social  . Drug use: No  . Sexual activity: Not on file   Other Topics Concern  . Not on file   Social History Narrative  . No narrative on file   She is divorced, she has 2 sons, 79 and 87 yo   She is a retired Marine scientist, she used to Hotel manager in college.   FAMILY HISTORY: Family History  Problem Relation Age of Onset  . Breast cancer Maternal Aunt        dx. 50-51y; d. 54-55y  . Colon cancer Maternal Grandmother 80       d. 69y  . Cancer Maternal Grandfather        gastric and pancreatic cancer; dx 82-83y; d. 83y  . Colon polyps Mother        "a few"  . Dementia Mother   . Hypertension Mother   . Hypertension Father   . Diabetes Maternal Uncle   . Other Maternal Uncle  dx renal tumor, NOS in his 73s  . Heart attack Paternal Grandfather   . Congestive Heart Failure Paternal Grandfather   . Fibrocystic breast disease Cousin   . Other Cousin        maternal 1st cousins hx partial colon resection, maybe due to diverticulosis  . Heart attack Paternal Uncle        d. 75  . Diabetes Paternal Uncle   . Congestive Heart Failure Paternal Uncle        d. 75y  . Leukemia Cousin        paternal 1st cousin d. 21y  . Prostate cancer Cousin        paternal 1st cousin dx prostate cancer    ALLERGIES:  is allergic to other and penicillins.  MEDICATIONS:  Current Outpatient Prescriptions  Medication Sig Dispense Refill  . aspirin EC 81 MG tablet Take 81 mg by mouth daily.    . cyclobenzaprine (FLEXERIL) 10 MG tablet Take 10 mg by mouth 3 (three) times daily as needed for muscle spasms.    Marland Kitchen letrozole (FEMARA) 2.5 MG tablet TAKE 1 TABLET BY MOUTH DAILY 90 tablet 3  . Multiple Vitamin (MULTIVITAMIN WITH MINERALS) TABS tablet Take 1 tablet by mouth daily.    . ranitidine (ZANTAC) 150 MG tablet Take 150 mg by mouth 2 (two) times daily.     No current facility-administered medications for this visit.     REVIEW OF SYSTEMS:  Constitutional:  Denies fevers, chills or abnormal night sweats Eyes: Denies blurriness of vision, double vision or watery eyes Ears, nose, mouth, throat, and face: Denies mucositis or sore throat Respiratory: Denies cough, dyspnea or wheezes Cardiovascular: Denies palpitation, chest discomfort or lower extremity swelling Gastrointestinal:  Denies nausea, heartburn or change in bowel habits Skin: Denies abnormal skin rashes Lymphatics: Denies new lymphadenopathy or easy bruising Musculoskeletal: (+) some bone pain attributed to Letrozole Neurological:Denies numbness, tingling or new weaknesses Behavioral/Psych: Mood is stable, no new changes  All other systems were reviewed with the patient and are negative.  PHYSICAL EXAMINATION: ECOG PERFORMANCE STATUS: 0 - Asymptomatic  Vitals:   08/01/17 1249  BP: 138/74  Pulse: 73  Resp: 18  Temp: 98.5 F (36.9 C)  SpO2: 100%   Filed Weights   08/01/17 1249  Weight: 138 lb 9.6 oz (62.9 kg)    GENERAL:alert, no distress and comfortable SKIN: skin color, texture, turgor are normal, no rashes or significant lesions EYES: normal, conjunctiva are pink and non-injected, sclera clear OROPHARYNX:no exudate, no erythema and lips, buccal mucosa, and tongue normal  NECK: supple, thyroid normal size, non-tender, without nodularity LYMPH:  no palpable lymphadenopathy in the cervical, axillary or inguinal LUNGS: clear to auscultation and percussion with normal breathing effort HEART: regular rate & rhythm and no murmurs and no lower extremity edema ABDOMEN:abdomen soft, non-tender and normal bowel sounds Musculoskeletal:no cyanosis of digits and no clubbing  PSYCH: alert & oriented x 3 with fluent speech NEURO: no focal motor/sensory deficits Breasts: Breast inspection showed right breast is surgically absent, Implant in place, incision scar has healed well. No skin erythema or discharge. Palpation of the left breasts and axilla revealed no obvious mass that I could  appreciate.   LABORATORY DATA:  I have reviewed the data as listed CBC Latest Ref Rng & Units 08/01/2017 01/29/2017 01/29/2017  WBC 3.9 - 10.3 10e3/uL 4.8 - -  Hemoglobin 11.6 - 15.9 g/dL 12.3 11.6(L) 10.7(L)  Hematocrit 34.8 - 46.6 % 37.4 34.0(L) -  Platelets 145 -  400 10e3/uL 262 - -   CMP Latest Ref Rng & Units 08/01/2017 01/29/2017 01/23/2017  Glucose 70 - 140 mg/dl 87 153(H) 81  BUN 7.0 - 26.0 mg/dL 13.1 15 16.0  Creatinine 0.6 - 1.1 mg/dL 1.0 0.80 0.9  Sodium 136 - 145 mEq/L 140 140 141  Potassium 3.5 - 5.1 mEq/L 4.6 4.0 3.8  Chloride 101 - 111 mmol/L - 103 -  CO2 22 - 29 mEq/L 28 - 25  Calcium 8.4 - 10.4 mg/dL 9.9 - 9.7  Total Protein 6.4 - 8.3 g/dL 7.5 - 7.4  Total Bilirubin 0.20 - 1.20 mg/dL 0.41 - 0.43  Alkaline Phos 40 - 150 U/L 85 - 91  AST 5 - 34 U/L 17 - 18  ALT 0 - 55 U/L 12 - 16     PATHOLOGY REPORT  Diagnosis 08/02/2016 1. Breast, simple mastectomy, Right - INVASIVE DUCTAL CARCINOMA, GRADE I/III, SPANNING 0.6 CM. - DUCTAL CARCINOMA IN SITU WITH CALCIFICATIONS, HIGH GRADE. - DUCTAL CARCINOMA IN SITU IS FOCALLY PRESENT AT THE POSTERIOR MARGIN AND BROADLY LESS THAN 0.1 CM FROM THE SAME POSTERIOR MARGIN. - SEE ONCOLOGY TABLE BELOW. 2. Nipple Biopsy, Right - BENIGN BREAST PARENCHYMA. - THERE IS NO EVIDENCE OF MALIGNANCY. 3. Lymph node, sentinel, biopsy, Right Axillary - THERE IS NO EVIDENCE OF CARCINOMA OF 1 OF 1 LYMPH NODE (0/1).  Diagnosis 05/10/2016 Breast, right, needle core biopsy, LIQ - DUCTAL CARCINOMA IN SITU WITH CALCIFICATIONS. - SEE COMMENT.  Genetic Testing 10/29/16 Negative for known pathogenic mutations within any of 42 genes on the [facility] Common Hereditary Cancers Panel (Breast, Gyn, GI).  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  The 42-gene [facility] Common Hereditary Cancers Panel (Breast, Gyn, GI) performed by [facility] [address], [place]) includes sequencing  and/or deletion/duplication analysis for the following genes: APC, ATM, AXIN2, BARD1, BMPR1A, BRCA1, BRCA2, BRIP1, CDH1, CDKN2A, CHEK2, DICER1, EPCAM, GREM1, KIT, MEN1, MLH1, MSH2, MSH6, MUTYH, NBN, NF1, PALB2, PDGFRA, PMS2, POLD1, POLE, PTEN, RAD50, RAD51C, RAD51D, SDHA, SDHB, SDHC, SDHD, SMAD4, SMARCA4, STK11, TP53, TSC1, TSC2, and VHL.  Date of report is October 29, 2016.   RADIOGRAPHIC STUDIES: I have personally reviewed the radiological images as listed and agreed with the findings in the report. No results found.   Screening mammogram 05/03/17 IMPRESSION: No mammographic evidence of malignancy. A result letter of this screening mammogram will be mailed directly to the patient. RECOMMENDATION: Screening mammogram in one year.    MR breast b/l w wo contrast 06/19/2016 IMPRESSION: 1. Extensive linear non mass enhancement involving the entire lower right breast, including the lower inner quadrant and the lower outer quadrant, spanning approximately 6.4 x 5.5 x 3.0 cm, including the area of biopsy proven DCIS. Extensive DCIS is suspected. This area of enhancement is much larger than the calcifications identified on recent mammography. 2. No pathologic lymphadenopathy. Multiple normal-appearing right axillary lymph nodes. 3. No MRI evidence of malignancy involving the left breast.  MM diagnostic R 05/01/2016 FINDINGS: Spot magnification CC and lateral views of the right breast are      submitted. There is a group of indeterminate microcalcifications in the medial lower right breast measuring 3.8 x 3.3 x 2.6 cm.  IMPRESSION: Suspicious findings.  RECOMMENDATION: Stereotactic core biopsy right breast calcifications   ASSESSMENT & PLAN: 66 y.o. Caucasian female, presented with screening discovered a right breast DCIS.  1. Breast cancer of the lower inner quadrant of right breast, pT1aN0M0 stage  I invasive ductal carcinoma, grade 1, and high-grade DCIS, ER strongly positive,  PR-, HER2- -I previously discussed her breast imaging and surgical path results with patient and her family members in great detail. -Her initial biopsy only showed DCIS, but the final surgical path showed small invasive ductal carcinoma, grade 1, 0.6 cm, and high-grade DCIS. -Giving the small size of the invasive tumor, low-grade, strongly ER positivity, I do not think she needs adjuvant chemotherapy, and we'll not order Oncotype. -We previously discussed the risk of recurrence after surgery. She does have positive posterior margin for DCIS, but Dr. Donne Hazel does not think he can resect more margins. Her case was discussed in breast tumor conference earlier this week, adjuvant radiation was not recommended. -She has started adjuvant letrozole, tolerating well, we'll continue for 5 years -We'll continue breast cancer surveillance with annual screening mammogram, self-exam, routine office visit and exam -She is clinically doing very well, exam is unremarkable, June 2018 mammogram was normal. lab reviewed, her CBC and CMP are normal. No clinical concern of recurrence. -The patient will continue to follow up with me, will change her visit to every 6 months from now on, due to her low risk for recurrence   2. Genetics -She is positive family history of breast cancer and multiple other malignancies, she is interested in genetics. -Genetic results from 10/29/16 were negative.  3. Bone health  -We discussed that aromatase inhibitor may weak her bone -I recommend her to have a baseline bone density scan, and follow-up every 2 years -The patient's recent bone pain could be attributed to Letrozole. She may take Ibuprofen for pain relief. -I encouraged the patient to exercise as able to improve overall health and reduce discomfort. -The patient may take Biotin for hair, skin, and nail health as needed. -I will try to obtain last bone density scan records from [facility] in [place], will call them  today   4. Financial information -She is planning to use medicare when she turns 46.  -I will refer her to social work for more information for her questions.    Plan -Refer to Social Work -Lab and f/u in 6 months  -I will try to obtain bone density scan records from [place] in [place]. -Continue letrozole  No orders of the defined types were placed in this encounter.   All questions were answered. The patient knows to call the clinic with any problems, questions or concerns. I spent 15 minutes counseling the patient face to face. The total time spent in the appointment was 20 minutes and more than 50% was on counseling.  This document serves as a record of services personally performed by Truitt Merle, MD. It was created on her behalf by Joslyn Devon, a trained medical scribe. The creation of this record is based on the scribe's personal observations and the provider's statements to them. This document has been checked and approved by the attending provider.     Truitt Merle  08/01/2017   Addendum I received her outside bone density scan report, which showed osteopenia, with T score -1.6 at the distal one third of the left forearm.  Truitt Merle  08/01/2017

## 2017-08-01 ENCOUNTER — Encounter: Payer: Self-pay | Admitting: Hematology

## 2017-08-01 ENCOUNTER — Encounter (INDEPENDENT_AMBULATORY_CARE_PROVIDER_SITE_OTHER): Payer: Self-pay

## 2017-08-01 ENCOUNTER — Other Ambulatory Visit (HOSPITAL_BASED_OUTPATIENT_CLINIC_OR_DEPARTMENT_OTHER): Payer: BLUE CROSS/BLUE SHIELD

## 2017-08-01 ENCOUNTER — Ambulatory Visit (HOSPITAL_BASED_OUTPATIENT_CLINIC_OR_DEPARTMENT_OTHER): Payer: BLUE CROSS/BLUE SHIELD | Admitting: Hematology

## 2017-08-01 VITALS — BP 138/74 | HR 73 | Temp 98.5°F | Resp 18 | Ht 60.0 in | Wt 138.6 lb

## 2017-08-01 DIAGNOSIS — Z79811 Long term (current) use of aromatase inhibitors: Secondary | ICD-10-CM | POA: Diagnosis not present

## 2017-08-01 DIAGNOSIS — C50311 Malignant neoplasm of lower-inner quadrant of right female breast: Secondary | ICD-10-CM

## 2017-08-01 DIAGNOSIS — Z170 Estrogen receptor positive status [ER+]: Secondary | ICD-10-CM | POA: Diagnosis not present

## 2017-08-01 LAB — CBC WITH DIFFERENTIAL/PLATELET
BASO%: 0.2 % (ref 0.0–2.0)
Basophils Absolute: 0 10*3/uL (ref 0.0–0.1)
EOS ABS: 0.1 10*3/uL (ref 0.0–0.5)
EOS%: 2.5 % (ref 0.0–7.0)
HEMATOCRIT: 37.4 % (ref 34.8–46.6)
HGB: 12.3 g/dL (ref 11.6–15.9)
LYMPH%: 41.8 % (ref 14.0–49.7)
MCH: 28.3 pg (ref 25.1–34.0)
MCHC: 32.9 g/dL (ref 31.5–36.0)
MCV: 86 fL (ref 79.5–101.0)
MONO#: 0.4 10*3/uL (ref 0.1–0.9)
MONO%: 8.6 % (ref 0.0–14.0)
NEUT%: 46.9 % (ref 38.4–76.8)
NEUTROS ABS: 2.2 10*3/uL (ref 1.5–6.5)
Platelets: 262 10*3/uL (ref 145–400)
RBC: 4.35 10*6/uL (ref 3.70–5.45)
RDW: 13.8 % (ref 11.2–14.5)
WBC: 4.8 10*3/uL (ref 3.9–10.3)
lymph#: 2 10*3/uL (ref 0.9–3.3)

## 2017-08-01 LAB — COMPREHENSIVE METABOLIC PANEL
ALT: 12 U/L (ref 0–55)
AST: 17 U/L (ref 5–34)
Albumin: 4 g/dL (ref 3.5–5.0)
Alkaline Phosphatase: 85 U/L (ref 40–150)
Anion Gap: 7 mEq/L (ref 3–11)
BUN: 13.1 mg/dL (ref 7.0–26.0)
CALCIUM: 9.9 mg/dL (ref 8.4–10.4)
CHLORIDE: 105 meq/L (ref 98–109)
CO2: 28 mEq/L (ref 22–29)
Creatinine: 1 mg/dL (ref 0.6–1.1)
EGFR: 72 mL/min/{1.73_m2} — ABNORMAL LOW (ref >90)
Glucose: 87 mg/dl (ref 70–140)
POTASSIUM: 4.6 meq/L (ref 3.5–5.1)
Sodium: 140 mEq/L (ref 136–145)
Total Bilirubin: 0.41 mg/dL (ref 0.20–1.20)
Total Protein: 7.5 g/dL (ref 6.4–8.3)

## 2017-08-02 ENCOUNTER — Encounter: Payer: Self-pay | Admitting: *Deleted

## 2017-08-02 ENCOUNTER — Telehealth: Payer: Self-pay

## 2017-08-02 NOTE — Progress Notes (Signed)
[place] Work  Clinical Social Work was referred by Futures trader for information pertaining to [employer].  Clinical Social Worker not aware of supplemental insurance options.  CSW left voicemail for patient with information on [place] [employer] as a possible resource and encouraged patient to follow up with CSW for any additional questions.      Maryjean Morn, MSW, LCSW, OSW-C Clinical Social Worker [facility] [phone]

## 2017-09-09 ENCOUNTER — Encounter: Payer: Self-pay | Admitting: Hematology

## 2018-01-31 ENCOUNTER — Ambulatory Visit: Payer: BLUE CROSS/BLUE SHIELD | Admitting: Hematology

## 2018-01-31 ENCOUNTER — Other Ambulatory Visit: Payer: BLUE CROSS/BLUE SHIELD

## 2018-02-18 ENCOUNTER — Telehealth: Payer: Self-pay | Admitting: *Deleted
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
## 2018-02-20 ENCOUNTER — Inpatient Hospital Stay: Payer: Medicare Other

## 2018-02-20 ENCOUNTER — Inpatient Hospital Stay: Payer: Medicare Other | Attending: Hematology

## 2018-02-20 ENCOUNTER — Encounter: Payer: Self-pay | Admitting: Hematology

## 2018-02-20 ENCOUNTER — Inpatient Hospital Stay (HOSPITAL_BASED_OUTPATIENT_CLINIC_OR_DEPARTMENT_OTHER): Payer: Medicare Other | Admitting: Hematology

## 2018-02-20 ENCOUNTER — Telehealth: Payer: Self-pay | Admitting: Hematology

## 2018-02-20 VITALS — BP 145/89 | HR 88 | Temp 98.1°F | Resp 17 | Ht 60.0 in | Wt 140.7 lb

## 2018-02-20 DIAGNOSIS — Z170 Estrogen receptor positive status [ER+]: Secondary | ICD-10-CM

## 2018-02-20 DIAGNOSIS — Z79811 Long term (current) use of aromatase inhibitors: Secondary | ICD-10-CM

## 2018-02-20 DIAGNOSIS — M899 Disorder of bone, unspecified: Secondary | ICD-10-CM

## 2018-02-20 DIAGNOSIS — M8580 Other specified disorders of bone density and structure, unspecified site: Secondary | ICD-10-CM

## 2018-02-20 DIAGNOSIS — C50311 Malignant neoplasm of lower-inner quadrant of right female breast: Secondary | ICD-10-CM

## 2018-02-20 DIAGNOSIS — E2839 Other primary ovarian failure: Secondary | ICD-10-CM

## 2018-02-20 LAB — CBC WITH DIFFERENTIAL/PLATELET
Basophils Absolute: 0 10*3/uL (ref 0.0–0.1)
Basophils Relative: 1 %
EOS ABS: 0.1 10*3/uL (ref 0.0–0.5)
EOS PCT: 3 %
HCT: 40 % (ref 34.8–46.6)
Hemoglobin: 13.3 g/dL (ref 11.6–15.9)
LYMPHS ABS: 1.7 10*3/uL (ref 0.9–3.3)
Lymphocytes Relative: 38 %
MCH: 28.5 pg (ref 25.1–34.0)
MCHC: 33.2 g/dL (ref 31.5–36.0)
MCV: 85.9 fL (ref 79.5–101.0)
MONO ABS: 0.4 10*3/uL (ref 0.1–0.9)
MONOS PCT: 8 %
Neutro Abs: 2.4 10*3/uL (ref 1.5–6.5)
Neutrophils Relative %: 50 %
PLATELETS: 238 10*3/uL (ref 145–400)
RBC: 4.66 MIL/uL (ref 3.70–5.45)
RDW: 13.6 % (ref 11.2–14.5)
WBC: 4.7 10*3/uL (ref 3.9–10.3)

## 2018-02-20 LAB — COMPREHENSIVE METABOLIC PANEL
ALT: 16 U/L (ref 0–55)
ANION GAP: 10 (ref 3–11)
AST: 20 U/L (ref 5–34)
Albumin: 4.1 g/dL (ref 3.5–5.0)
Alkaline Phosphatase: 94 U/L (ref 40–150)
BUN: 13 mg/dL (ref 7–26)
CHLORIDE: 105 mmol/L (ref 98–109)
CO2: 27 mmol/L (ref 22–29)
Calcium: 10.1 mg/dL (ref 8.4–10.4)
Creatinine, Ser: 1.04 mg/dL (ref 0.60–1.10)
GFR, EST NON AFRICAN AMERICAN: 55 mL/min — AB (ref >60)
Glucose, Bld: 89 mg/dL (ref 70–140)
Potassium: 4.1 mmol/L (ref 3.5–5.1)
SODIUM: 142 mmol/L (ref 136–145)
Total Bilirubin: 0.4 mg/dL (ref 0.2–1.2)
Total Protein: 7.7 g/dL (ref 6.4–8.3)

## 2018-02-20 NOTE — Telephone Encounter (Signed)
Scheduled appt per 3/21 los - Gave patient AVS and calender per los. - lab and f/u in 6 months.

## 2018-02-21 ENCOUNTER — Telehealth: Payer: Self-pay | Admitting: *Deleted

## 2018-02-21 LAB — KAPPA/LAMBDA LIGHT CHAINS
KAPPA FREE LGHT CHN: 15.4 mg/L (ref 3.3–19.4)
Kappa, lambda light chain ratio: 1.29 (ref 0.26–1.65)
LAMDA FREE LIGHT CHAINS: 11.9 mg/L (ref 5.7–26.3)

## 2018-02-21 NOTE — Telephone Encounter (Signed)
Spoke with pt and informed her re:  [facility] radiologist had reviewed CT scan results.  Lesions likely benign, and no further testing needed at the present  As per Dr. Ernestina Penna instructions.   Pt voiced understanding.

## 2018-02-24 LAB — PROTEIN ELECTROPHORESIS, SERUM, WITH REFLEX
A/G RATIO SPE: 1.1 (ref 0.7–1.7)
ALBUMIN ELP: 3.9 g/dL (ref 2.9–4.4)
ALPHA-2-GLOBULIN: 0.9 g/dL (ref 0.4–1.0)
Alpha-1-Globulin: 0.2 g/dL (ref 0.0–0.4)
BETA GLOBULIN: 1.2 g/dL (ref 0.7–1.3)
GAMMA GLOBULIN: 1.1 g/dL (ref 0.4–1.8)
Globulin, Total: 3.4 g/dL (ref 2.2–3.9)
Total Protein ELP: 7.3 g/dL (ref 6.0–8.5)

## 2018-02-28 ENCOUNTER — Telehealth: Payer: Self-pay | Admitting: *Deleted

## 2018-02-28 NOTE — Telephone Encounter (Signed)
TCT patient regarding results of MM labs. Spoke with pt and informed her that the screening for multiple myeloma was negative. Pt voiced understanding and had seen the results on My Chart. She had no questions or concerns at this time.

## 2018-03-04 NOTE — Telephone Encounter (Signed)
Error opening  

## 2018-05-08 ENCOUNTER — Ambulatory Visit
Admission: RE | Admit: 2018-05-08 | Discharge: 2018-05-08 | Disposition: A | Discharge status: Home / Self Care | Payer: Medicare Other | Source: Ambulatory Visit | Attending: Hematology | Admitting: Hematology

## 2018-05-08 ENCOUNTER — Other Ambulatory Visit: Payer: Self-pay | Admitting: Hematology

## 2018-05-08 DIAGNOSIS — Z1231 Encounter for screening mammogram for malignant neoplasm of breast: Secondary | ICD-10-CM

## 2018-05-08 DIAGNOSIS — Z853 Personal history of malignant neoplasm of breast: Secondary | ICD-10-CM

## 2018-05-08 DIAGNOSIS — Z0000 Encounter for general adult medical examination without abnormal findings: Secondary | ICD-10-CM

## 2018-05-08 DIAGNOSIS — E2839 Other primary ovarian failure: Secondary | ICD-10-CM

## 2018-05-08 DIAGNOSIS — Z170 Estrogen receptor positive status [ER+]: Principal | ICD-10-CM

## 2018-05-08 DIAGNOSIS — C50311 Malignant neoplasm of lower-inner quadrant of right female breast: Secondary | ICD-10-CM

## 2018-07-03 ENCOUNTER — Other Ambulatory Visit: Payer: Self-pay | Admitting: Hematology

## 2018-07-03 DIAGNOSIS — C50311 Malignant neoplasm of lower-inner quadrant of right female breast: Secondary | ICD-10-CM

## 2018-07-03 DIAGNOSIS — Z170 Estrogen receptor positive status [ER+]: Principal | ICD-10-CM

## 2018-07-04 ENCOUNTER — Other Ambulatory Visit: Payer: Self-pay | Admitting: Nurse Practitioner

## 2018-07-04 DIAGNOSIS — C50311 Malignant neoplasm of lower-inner quadrant of right female breast: Secondary | ICD-10-CM

## 2018-07-04 DIAGNOSIS — Z170 Estrogen receptor positive status [ER+]: Principal | ICD-10-CM

## 2018-07-04 MED ORDER — LETROZOLE 2.5 MG PO TABS: 2.5000 mg | ORAL_TABLET | Freq: Every day | ORAL | 3 refills | Status: DC

## 2018-08-14 ENCOUNTER — Telehealth: Payer: Self-pay | Admitting: Hematology

## 2018-08-14 NOTE — Telephone Encounter (Signed)
YF PAL 9/19. Moved appointments to 10/11. Left message for patient. Schedule mailed.

## 2018-08-14 NOTE — Telephone Encounter (Signed)
Patient called to reschedule  °

## 2018-08-21 ENCOUNTER — Ambulatory Visit: Payer: 59 | Admitting: Hematology

## 2018-08-21 ENCOUNTER — Other Ambulatory Visit: Payer: 59

## 2018-09-12 ENCOUNTER — Ambulatory Visit: Payer: 59 | Admitting: Hematology

## 2018-09-12 ENCOUNTER — Other Ambulatory Visit: Payer: 59
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
## 2018-09-18 ENCOUNTER — Telehealth: Payer: Self-pay | Admitting: Hematology

## 2018-09-18 ENCOUNTER — Ambulatory Visit
Admission: RE | Admit: 2018-09-18 | Discharge: 2018-09-18 | Disposition: A | Discharge status: Home / Self Care | Payer: Medicare Other | Source: Ambulatory Visit | Attending: Hematology | Admitting: Hematology

## 2018-09-18 ENCOUNTER — Inpatient Hospital Stay: Payer: Medicare Other | Attending: Hematology

## 2018-09-18 ENCOUNTER — Inpatient Hospital Stay (HOSPITAL_BASED_OUTPATIENT_CLINIC_OR_DEPARTMENT_OTHER): Payer: Medicare Other | Admitting: Hematology

## 2018-09-18 VITALS — BP 151/62 | HR 72 | Temp 98.3°F | Resp 18 | Ht 60.0 in | Wt 138.3 lb

## 2018-09-18 DIAGNOSIS — Z79811 Long term (current) use of aromatase inhibitors: Secondary | ICD-10-CM

## 2018-09-18 DIAGNOSIS — M810 Age-related osteoporosis without current pathological fracture: Secondary | ICD-10-CM | POA: Insufficient documentation

## 2018-09-18 DIAGNOSIS — Z23 Encounter for immunization: Secondary | ICD-10-CM | POA: Insufficient documentation

## 2018-09-18 DIAGNOSIS — Z170 Estrogen receptor positive status [ER+]: Secondary | ICD-10-CM | POA: Diagnosis not present

## 2018-09-18 DIAGNOSIS — C50311 Malignant neoplasm of lower-inner quadrant of right female breast: Secondary | ICD-10-CM | POA: Insufficient documentation

## 2018-09-18 LAB — COMPREHENSIVE METABOLIC PANEL
ALBUMIN: 3.9 g/dL (ref 3.5–5.0)
ALK PHOS: 97 U/L (ref 38–126)
ALT: 21 U/L (ref 0–44)
AST: 22 U/L (ref 15–41)
Anion gap: 10 (ref 5–15)
BILIRUBIN TOTAL: 0.3 mg/dL (ref 0.3–1.2)
BUN: 13 mg/dL (ref 8–23)
CALCIUM: 9.4 mg/dL (ref 8.9–10.3)
CO2: 27 mmol/L (ref 22–32)
Chloride: 106 mmol/L (ref 98–111)
Creatinine, Ser: 0.88 mg/dL (ref 0.44–1.00)
GFR calc Af Amer: >60 mL/min (ref >60)
GFR calc non Af Amer: >60 mL/min (ref >60)
GLUCOSE: 93 mg/dL (ref 70–99)
Potassium: 4.4 mmol/L (ref 3.5–5.1)
Sodium: 143 mmol/L (ref 135–145)
TOTAL PROTEIN: 7.2 g/dL (ref 6.5–8.1)

## 2018-09-18 LAB — CBC WITH DIFFERENTIAL/PLATELET
ABS IMMATURE GRANULOCYTES: 0.01 10*3/uL (ref 0.00–0.07)
BASOS PCT: 1 %
Basophils Absolute: 0 10*3/uL (ref 0.0–0.1)
EOS ABS: 0.1 10*3/uL (ref 0.0–0.5)
Eosinophils Relative: 2 %
HEMATOCRIT: 39.4 % (ref 36.0–46.0)
Hemoglobin: 12.7 g/dL (ref 12.0–15.0)
Immature Granulocytes: 0 %
LYMPHS ABS: 2.2 10*3/uL (ref 0.7–4.0)
Lymphocytes Relative: 40 %
MCH: 27.9 pg (ref 26.0–34.0)
MCHC: 32.2 g/dL (ref 30.0–36.0)
MCV: 86.4 fL (ref 80.0–100.0)
MONO ABS: 0.5 10*3/uL (ref 0.1–1.0)
MONOS PCT: 9 %
Neutro Abs: 2.8 10*3/uL (ref 1.7–7.7)
Neutrophils Relative %: 48 %
PLATELETS: 266 10*3/uL (ref 150–400)
RBC: 4.56 MIL/uL (ref 3.87–5.11)
RDW: 13.7 % (ref 11.5–15.5)
WBC: 5.6 10*3/uL (ref 4.0–10.5)
nRBC: 0 % (ref 0.0–0.2)

## 2018-09-18 MED ORDER — INFLUENZA VAC SPLIT QUAD 0.5 ML IM SUSY
0.5000 mL | PREFILLED_SYRINGE | Freq: Once | INTRAMUSCULAR | Status: AC
  Administered 2018-09-18: 0.5 mL via INTRAMUSCULAR

## 2018-09-18 MED ORDER — INFLUENZA VAC SPLIT QUAD 0.5 ML IM SUSY
PREFILLED_SYRINGE | INTRAMUSCULAR | Status: AC
Start: 2018-09-18 — End: ?
  Filled 2018-09-18: qty 0.5

## 2018-09-18 NOTE — Telephone Encounter (Signed)
Gave pt avs and calendar  °

## 2018-09-19 ENCOUNTER — Encounter: Payer: Self-pay | Admitting: Hematology

## 2019-03-17 ENCOUNTER — Telehealth: Payer: Self-pay | Admitting: Hematology

## 2019-03-17 NOTE — Telephone Encounter (Signed)
Called patient regarding upcoming Webex appointment, patient does not have access and would prefer it to be a telephone visit.
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
## 2019-03-19 ENCOUNTER — Other Ambulatory Visit: Payer: Medicare Other

## 2019-03-19 ENCOUNTER — Inpatient Hospital Stay: Payer: Medicare Other | Attending: Hematology | Admitting: Hematology

## 2019-03-19 ENCOUNTER — Other Ambulatory Visit: Payer: Self-pay

## 2019-03-19 DIAGNOSIS — C50311 Malignant neoplasm of lower-inner quadrant of right female breast: Secondary | ICD-10-CM

## 2019-03-19 DIAGNOSIS — Z170 Estrogen receptor positive status [ER+]: Secondary | ICD-10-CM | POA: Diagnosis not present

## 2019-03-19 MED ORDER — LETROZOLE 2.5 MG PO TABS: 2.5000 mg | ORAL_TABLET | Freq: Every day | ORAL | 3 refills | Status: DC

## 2019-03-20 ENCOUNTER — Encounter: Payer: Self-pay | Admitting: Hematology

## 2019-03-20 ENCOUNTER — Telehealth: Payer: Self-pay | Admitting: Hematology

## 2019-03-20 NOTE — Telephone Encounter (Signed)
No los per 4/16. °

## 2019-04-07 ENCOUNTER — Other Ambulatory Visit: Payer: Self-pay | Admitting: Hematology

## 2019-04-07 DIAGNOSIS — Z1231 Encounter for screening mammogram for malignant neoplasm of breast: Secondary | ICD-10-CM

## 2019-04-30 ENCOUNTER — Encounter: Payer: Self-pay | Admitting: Hematology

## 2019-06-02 ENCOUNTER — Other Ambulatory Visit: Payer: Self-pay

## 2019-06-02 ENCOUNTER — Ambulatory Visit
Admission: RE | Admit: 2019-06-02 | Discharge: 2019-06-02 | Disposition: A | Discharge status: Home / Self Care | Payer: Medicare Other | Source: Ambulatory Visit | Attending: Hematology | Admitting: Hematology

## 2019-06-02 DIAGNOSIS — Z1231 Encounter for screening mammogram for malignant neoplasm of breast: Secondary | ICD-10-CM

## 2020-03-28 ENCOUNTER — Other Ambulatory Visit: Payer: Self-pay | Admitting: Hematology

## 2020-03-28 DIAGNOSIS — Z170 Estrogen receptor positive status [ER+]: Secondary | ICD-10-CM

## 2020-03-28 DIAGNOSIS — C50311 Malignant neoplasm of lower-inner quadrant of right female breast: Secondary | ICD-10-CM

## 2020-03-29 NOTE — Telephone Encounter (Signed)
Pt has not been seen in this office since 03/23/2019.  Left vm for pt to call.  I will schedule an appt and then refill rx.

## 2020-03-31 NOTE — Telephone Encounter (Signed)
I left a second vm for patient to call our office.

## 2020-04-04 ENCOUNTER — Other Ambulatory Visit: Payer: Self-pay

## 2020-04-04 ENCOUNTER — Other Ambulatory Visit: Payer: Self-pay | Admitting: Hematology

## 2020-04-04 DIAGNOSIS — C50311 Malignant neoplasm of lower-inner quadrant of right female breast: Secondary | ICD-10-CM

## 2020-04-04 DIAGNOSIS — Z170 Estrogen receptor positive status [ER+]: Secondary | ICD-10-CM

## 2020-04-04 MED ORDER — LETROZOLE 2.5 MG PO TABS: 2.5000 mg | ORAL_TABLET | Freq: Every day | ORAL | 0 refills | Status: DC

## 2020-04-04 NOTE — Progress Notes (Signed)
Hayley Hayley Marks left vm requesting appt and refill on letrozole.  Scheduling message sent.  1 month supply of letrozole sent to [facility] [address] [place], [place].  I left a vm with this information for Hayley Marks.

## 2020-04-06 ENCOUNTER — Other Ambulatory Visit: Payer: Self-pay

## 2020-04-06 ENCOUNTER — Other Ambulatory Visit: Payer: Self-pay | Admitting: Hematology

## 2020-04-06 ENCOUNTER — Telehealth: Payer: Self-pay | Admitting: Hematology

## 2020-04-06 DIAGNOSIS — Z170 Estrogen receptor positive status [ER+]: Secondary | ICD-10-CM

## 2020-04-06 DIAGNOSIS — C50311 Malignant neoplasm of lower-inner quadrant of right female breast: Secondary | ICD-10-CM

## 2020-04-06 MED ORDER — LETROZOLE 2.5 MG PO TABS: 2.5000 mg | ORAL_TABLET | Freq: Every day | ORAL | 0 refills | Status: DC

## 2020-04-06 NOTE — Telephone Encounter (Signed)
Unable to reach pt- left voicemail- Scheduled appt on 6/2. Per 5/3 sch msg.

## 2020-04-06 NOTE — Progress Notes (Signed)
Hayley Marks called stating pharmacy did not receive prescription for letrozole.  Rx resent

## 2020-04-25 ENCOUNTER — Other Ambulatory Visit: Payer: Self-pay | Admitting: Hematology

## 2020-04-25 DIAGNOSIS — Z1231 Encounter for screening mammogram for malignant neoplasm of breast: Secondary | ICD-10-CM

## 2020-05-04 ENCOUNTER — Ambulatory Visit: Payer: Medicare Other | Admitting: Nurse Practitioner

## 2020-05-04 ENCOUNTER — Other Ambulatory Visit: Payer: Medicare Other

## 2020-05-05 ENCOUNTER — Inpatient Hospital Stay: Payer: Medicare Other | Admitting: Nurse Practitioner

## 2020-05-05 ENCOUNTER — Inpatient Hospital Stay: Payer: Medicare Other

## 2020-05-28 IMAGING — MG DIGITAL SCREENING UNILATERAL LEFT MAMMOGRAM WITH CAD AND TOMO
4 series · 4 of 12 positions shown · non-contrast
Comparison: Previous exam(s).

CLINICAL DATA: Screening.

EXAM:
DIGITAL SCREENING UNILATERAL LEFT MAMMOGRAM WITH CAD AND TOMO

[L CC synth-2D]
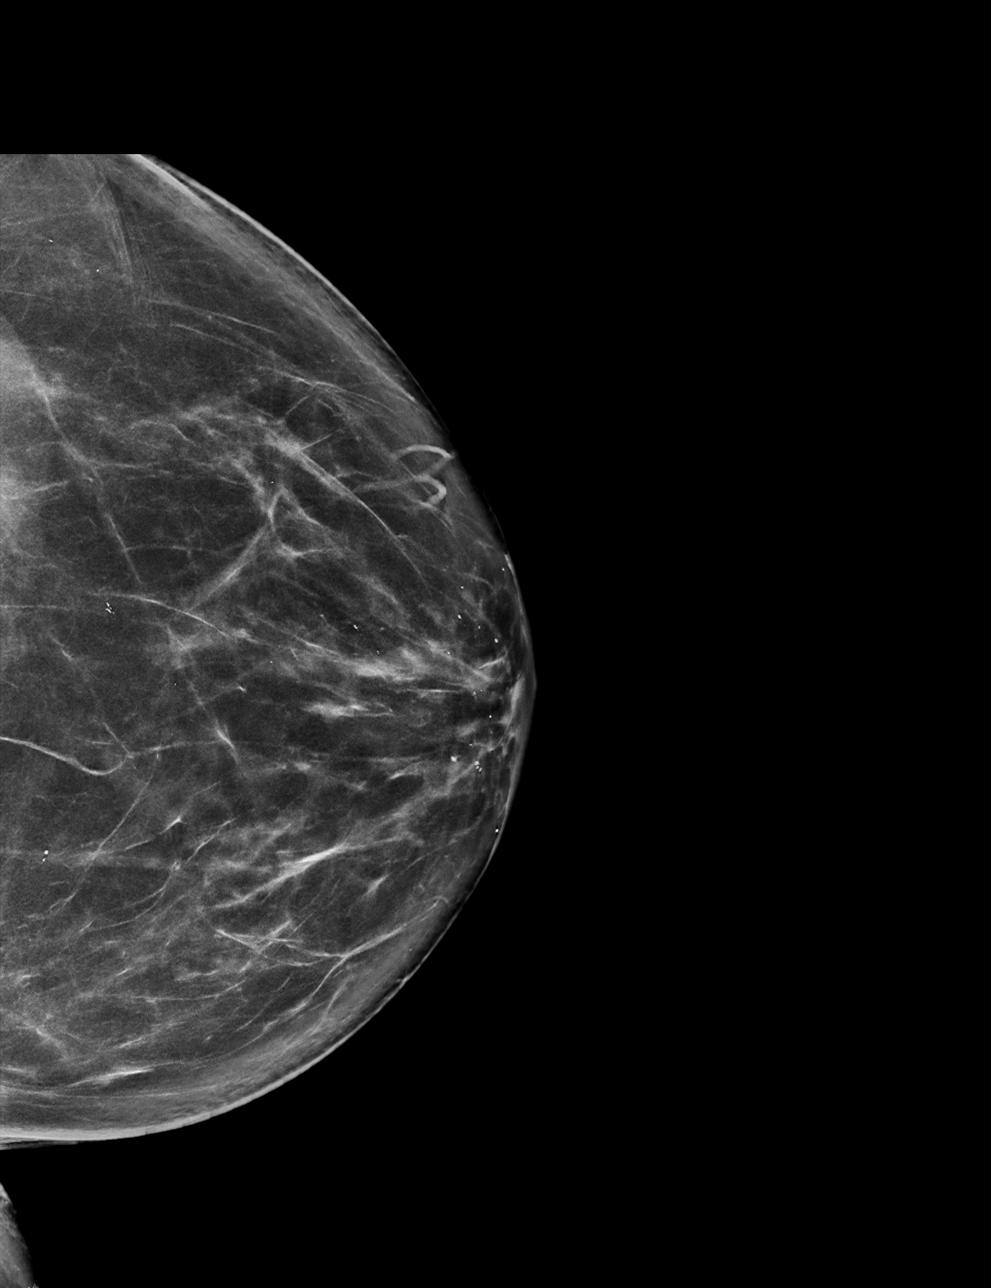

[L MLO synth-2D]
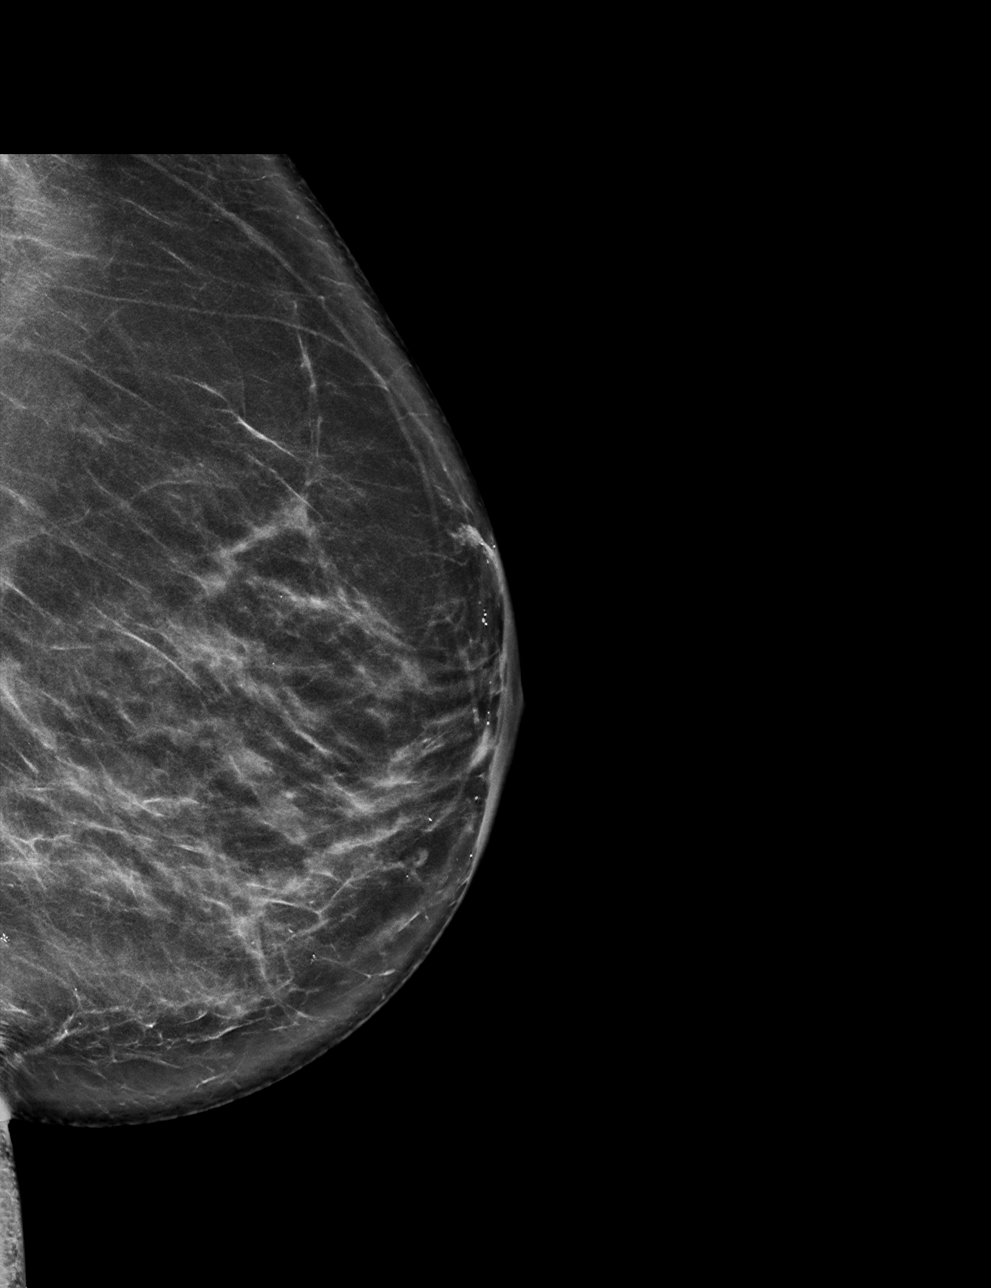

[L CC tomo · tomo slice 41/81.0]
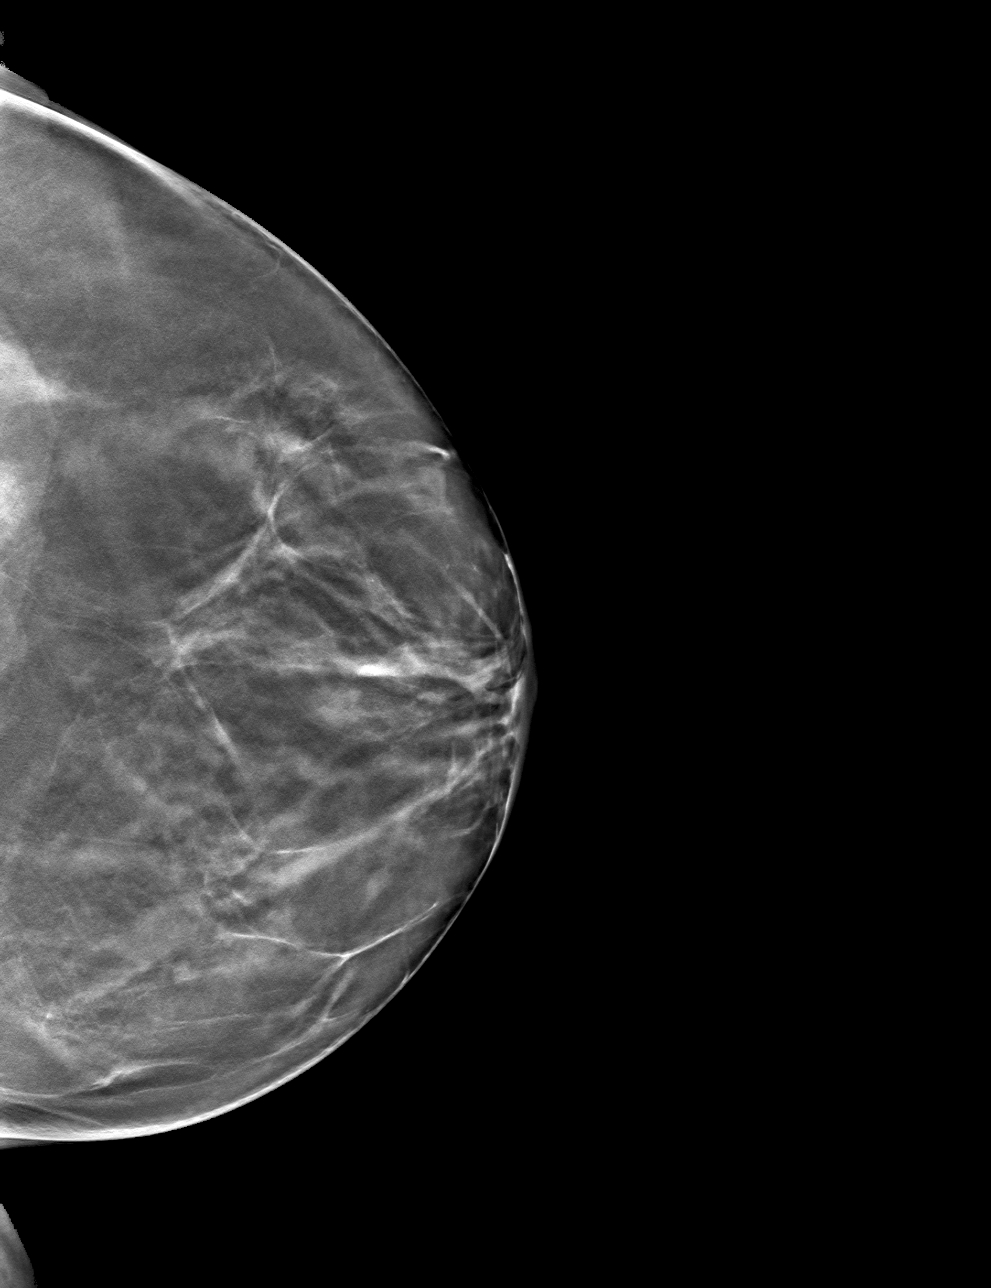

[L MLO tomo · tomo slice 38/75.0]
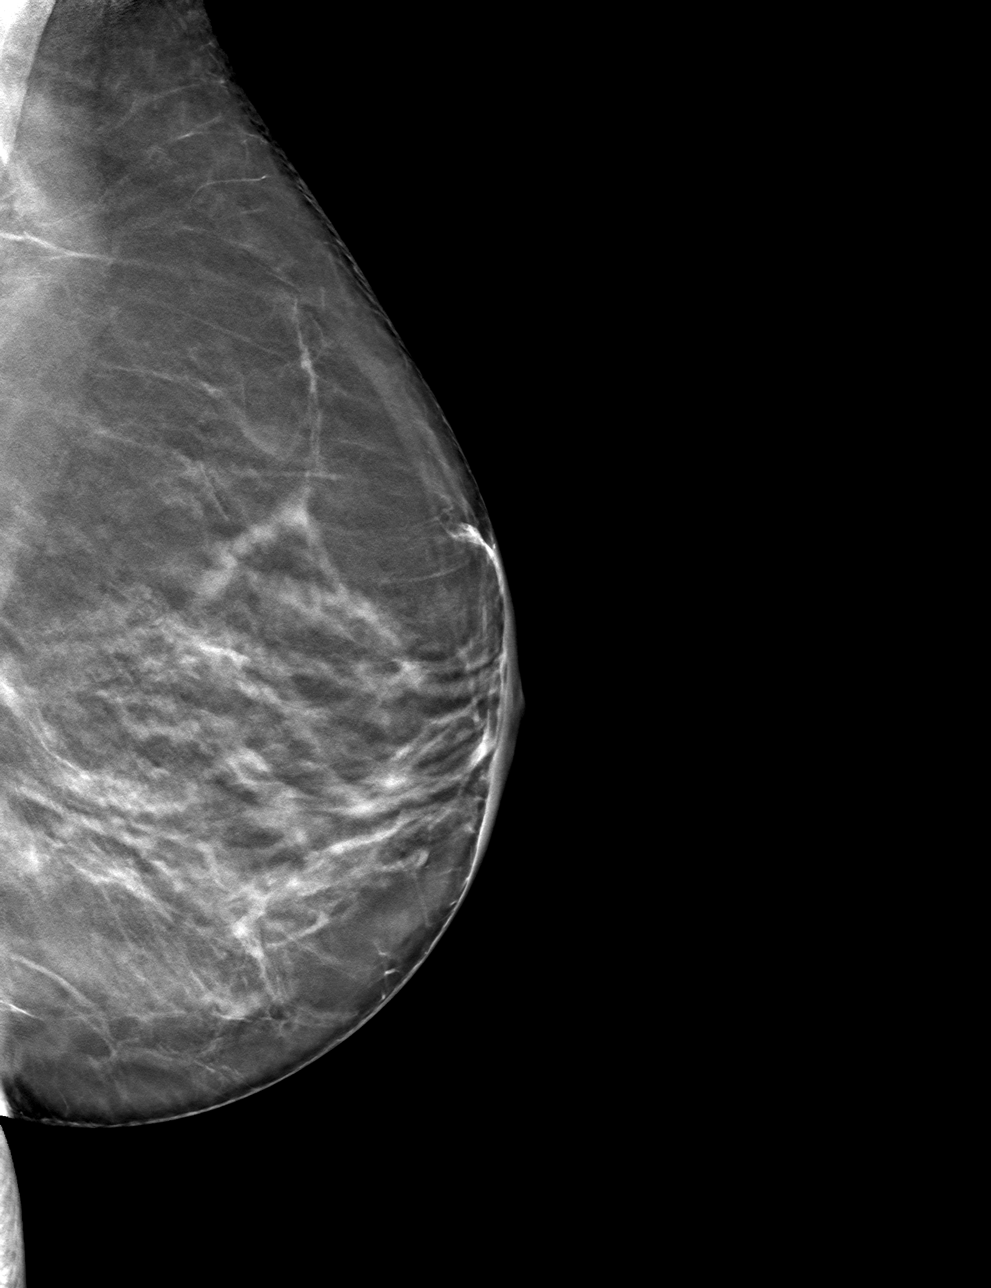

[4 of 12 positions shown; findings below may reference images not displayed]

ACR Breast Density Category b: There are scattered areas of
fibroglandular density.
FINDINGS: The patient has had a right mastectomy. There are no findings
suspicious for malignancy.

Images were processed with CAD.
IMPRESSION: No mammographic evidence of malignancy. A result letter of this
screening mammogram will be mailed directly to the patient.

RECOMMENDATION:
Screening mammogram in one year.  (Code:GY-Q-70Y)

BI-RADS CATEGORY  1: Negative.

## 2020-06-07 ENCOUNTER — Other Ambulatory Visit: Payer: Self-pay

## 2020-06-07 ENCOUNTER — Ambulatory Visit
Admission: RE | Admit: 2020-06-07 | Discharge: 2020-06-07 | Disposition: A | Discharge status: Home / Self Care | Payer: Medicare Other | Source: Ambulatory Visit | Attending: Hematology | Admitting: Hematology

## 2020-06-07 DIAGNOSIS — Z1231 Encounter for screening mammogram for malignant neoplasm of breast: Secondary | ICD-10-CM

## 2020-06-15 NOTE — Progress Notes (Signed)
Hayley Marks   [phone] Fax:(336) [phone]   Clinic Follow up Note   Patient Care Team: Lester [place], MD as PCP - General (Family Medicine) Rolm Bookbinder, MD as Consulting Physician (General Surgery) Truitt Merle, MD as Consulting Physician (Hematology) Gardenia Phlegm, NP as Nurse Practitioner (Hematology and Oncology) 06/16/2020  CHIEF COMPLAINT: F/u right breast cancer   SUMMARY OF ONCOLOGIC HISTORY: Oncology History Overview Note  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 female breast [facility])   Staging form: Breast, AJCC 7th Edition   - Clinical stage from 05/10/2016: Stage 0 (Tis (DCIS), N0, cM0(i+)) - Signed by Truitt Merle, MD on 08/22/2016   - Pathologic stage from 08/02/2016: Stage IA (T1b, N0, cM0) - Signed by Truitt Merle, MD on 08/22/2016    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 female breast ([place])  05/01/2016 Mammogram   Diagnostic mammo showed a group microcalcification in the medial lower right breast measuring 3.8X3.3X2.6cm    05/10/2016 Initial Diagnosis   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 female breast ([place])   05/10/2016 Initial Biopsy   Right breast biopsy showed DCIS   05/10/2016 Receptors her2   ER 95%+, PR 5%+   06/19/2016 Imaging   Breast MRI showed extensive non-mass enhancement involving the entire lower right breast, 6.4X5.5X3.0cm, no adenopathy, left breast (-)    08/02/2016 Surgery   Right mastectomy and SLN biopsy    08/02/2016 Pathology Results   Right mastectomy showed invasive ductal carcinoma, G1, and high grade DCIS, DCIS was focally positive at posterior margin and broadly less than 0.1cm from the same posterior margin, 1 SLN was negative, nipple biopsy was negative    08/02/2016 Receptors her2   ER 100% strongly +, PR-, HER2-, Ki67 5%    09/02/2016 -  Anti-estrogen oral therapy   Letrozole 2.'5mg'$  daily   11/01/2016 Genetic Testing   Genetic Testing 10/29/16 Negative for known pathogenic  mutations within any of 42 genes on the Invitae Common Hereditary Cancers Panel (Breast, Gyn, GI).  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  The 42-gene Invitae Common Hereditary Cancers Panel (Breast, Gyn, GI) performed by [facility], [place]) includes sequencing and/or deletion/duplication analysis for the following genes: APC, ATM, AXIN2, BARD1, BMPR1A, BRCA1, BRCA2, BRIP1, CDH1, CDKN2A, CHEK2, DICER1, EPCAM, GREM1, KIT, MEN1, MLH1, MSH2, MSH6, MUTYH, NBN, NF1, PALB2, PDGFRA, PMS2, POLD1, POLE, PTEN, RAD50, RAD51C, RAD51D, SDHA, SDHB, SDHC, SDHD, SMAD4, SMARCA4, STK11, TP53, TSC1, TSC2, and VHL.  Date of report is October 29, 2016.    05/08/2018 Mammogram   05/08/2018 Mammogram IMPRESSION: No mammographic evidence of malignancy. A result letter of this screening mammogram will be mailed directly to the patient.     CURRENT THERAPY: letrozole 2.5 mg once daily, started 09/06/2016  INTERVAL HISTORY: Hayley Marks returns for follow up as scheduled.  Her last visit with Dr. Burr Medico on 03/19/2019 was virtual, and lost to f/u during the COVID-19 pandemic and her mother's declining health. She had mammogram on 06/07/20 that was negative.  He presents to clinic today by herself.  She feels well, denies changes in her health overall.  She continues letrozole, tolerating but "just hates it."  She has hair thinning, brittle nails, and vaginal dryness.  Denies bone or joint pain except related to existing scoliosis, or hot flashes.  Denies new concerns in her breast.  She is satisfied with her mastectomy and reconstruction.  Her appetite  and energy are adequate, denies changes in her bowel habits.  Denies GI/GU/GYN bleeding.  Denies abdominal pain.  Denies recent fever or chills or infection.  She completed the COVID-19 vaccine series.   MEDICAL HISTORY:  Past Medical History:  Diagnosis Date  . Asthma    as child  .  Cancer ([place])    DCIS right breast  . GERD (gastroesophageal reflux disease)   . Scoliosis     SURGICAL HISTORY: Past Surgical History:  Procedure Laterality Date  . ABDOMINAL HYSTERECTOMY  1993  . AUGMENTATION MAMMAPLASTY Right 01/29/2017   Saline  . BREAST BIOPSY Right 05/10/2016   Stereo- Malignant  . BREAST RECONSTRUCTION WITH PLACEMENT OF TISSUE EXPANDER AND FLEX HD (ACELLULAR HYDRATED DERMIS) Right 08/02/2016   Procedure: RIGHT BREAST RECONSTRUCTION WITH PLACEMENT OF TISSUE EXPANDER;  Surgeon: Irene Limbo, MD;  Location: [place];  Service: Plastics;  Laterality: Right;  . MASTECTOMY Right 08/02/2016  . MASTOPEXY Left 01/29/2017   Procedure: MASTOPEXY;  Surgeon: Irene Limbo, MD;  Location: [place];  Service: Plastics;  Laterality: Left;  . NIPPLE SPARING MASTECTOMY/SENTINAL LYMPH NODE BIOPSY/RECONSTRUCTION/PLACEMENT OF TISSUE EXPANDER Right 08/02/2016   Procedure: RIGHT NIPPLE SPARING MASTECTOMY WITH SENTINAL LYMPH NODE BIOPSY;  Surgeon: Rolm Bookbinder, MD;  Location: [place];  Service: General;  Laterality: Right;  . REDUCTION MAMMAPLASTY Left   . REMOVAL OF TISSUE EXPANDER AND PLACEMENT OF IMPLANT Right 01/29/2017   Procedure: REMOVAL OF TISSUE EXPANDER AND PLACEMENT OF IMPLANT;  Surgeon: Irene Limbo, MD;  Location: [place];  Service: Plastics;  Laterality: Right;    I have reviewed the social history and family history with the patient and they are unchanged from previous note.  ALLERGIES:  is allergic to other and penicillins.  MEDICATIONS:  Current Outpatient Medications  Medication Sig Dispense Refill  . aspirin EC 81 MG tablet Take 81 mg by mouth daily.    . cyclobenzaprine (FLEXERIL) 10 MG tablet Take 10 mg by mouth 3 (three) times daily as needed for muscle spasms.    Marland Kitchen letrozole (FEMARA) 2.5 MG tablet Take 1 tablet (2.5 mg total) by mouth daily. 90 tablet 1  . Multiple Vitamin  (MULTIVITAMIN WITH MINERALS) TABS tablet Take 1 tablet by mouth daily.    Marland Kitchen omeprazole (PRILOSEC) 40 MG capsule Take 40 mg by mouth daily.    . ranitidine (ZANTAC) 150 MG tablet Take 150 mg by mouth 2 (two) times daily. (Patient not taking: Reported on 06/16/2020)     No current facility-administered medications for this visit.    PHYSICAL EXAMINATION: ECOG PERFORMANCE STATUS: 0 - Asymptomatic  There were no vitals filed for this visit. There were no vitals filed for this visit.  GENERAL:alert, no distress and comfortable SKIN: Mild localized rash to base of neck EYES: sclera clear OROPHARYNX: No erythema, thrush, ulcers, or obvious periodontal disease NECK: Without mass LYMPH:  no palpable cervical or supraclavicular lymphadenopathy LUNGS:  normal breathing effort HEART: no lower extremity edema Musculoskeletal: No focal tenderness to spine.  Full range of motion upper extremities NEURO: alert & oriented x 3 with fluent speech, normal gait Breasts: S/p right mastectomy with implant.  No palpable mass along the chest wall or implant, left breast, or either axilla that I could appreciate.  LABORATORY DATA:  I have reviewed the data as listed CBC Latest Ref Rng & Units 06/16/2020 09/18/2018 02/20/2018  WBC 4.0 - 10.5 K/uL 4.9 5.6 4.7  Hemoglobin 12.0 - 15.0 g/dL 11.9(L)  12.7 13.3  Hematocrit 36 - 46 % 36.5 39.4 40.0  Platelets 150 - 400 K/uL 232 266 238     CMP Latest Ref Rng & Units 06/16/2020 09/18/2018 02/20/2018  Glucose 70 - 99 mg/dL 78 93 89  BUN 8 - 23 mg/dL _0 Creatinine 0.44 - 1.00 mg/dL 1.09(H) 0.88 1.04  Sodium 135 - 145 mmol/L 142 143 142  Potassium 3.5 - 5.1 mmol/L 3.8 4.4 4.1  Chloride 98 - 111 mmol/L 108 106 105  CO2 22 - 32 mmol/L _1 Calcium 8.9 - 10.3 mg/dL 9.1 9.4 10.1  Total Protein 6.5 - 8.1 g/dL 7.0 7.2 7.7  Total Bilirubin 0.3 - 1.2 mg/dL 0.4 0.3 0.4  Alkaline Phos 38 - 126 U/L 76 97 94  AST 15 - 41 U/L _2 ALT 0 - 44 U/L _3 RADIOGRAPHIC STUDIES: I have personally reviewed the radiological images as listed and agreed with the findings in the report. No results found.   ASSESSMENT & PLAN: Hayley Marks is a 69 y.o. female with    1. 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 She is s/p right mastectomy and SNLB, -Given node negative disease, post-mastectomy radiation was not recommended.  -She started anti-estrogen therapy with adjuvant letrozolein 09/2016, plan to continue for 5 years.  Tolerating moderately well with hair thinning, brittle nails, and vaginal dryness -06/2020 mammogram showed no evidence of malignancy -On surveillance  2. Genetics -Genetic results from 10/29/16 were negative for known pathogenic mutations within any of 42 genes on the Invitae Common Hereditary Cancers Panel (Breast, Gyn, GI). One variant of uncertain significance (VUS) called "c.1243T>C (p.Ser415Pro)" was found in one copy of the PDGFRAgene.   3. Osteoporosis -09/2018 DEXA showed osteoporosis with lowest T-Score of -2.5 at forearm radius. -On calcium and vitamin D, she does not exercise routinely but plans to increase -Recently has not been on medication to strengthen her bones such as bisphosphonate but is now interested in starting Prolia per PCP closer to home, I will CC my note -Serum creatinine 1.09 today, no evidence of perio disease -If her density does not improve or worsens at next DEXA, may change letrozole to Tamoxifen.  We will follow-up after DEXA    Disposition:  Hayley Marks is clinically doing well.  Breast exam benign, CBC and CMP unremarkable except Hgb 11.9.  Hgb was 12.4 at PCP on 05/16/2020; left mammogram on 06/07/2020 showed no evidence of malignancy. Overall no clinical concern for breast cancer recurrence.  She will continue letrozole until  5-year goal in 09/2021, which she is tolerating moderately well with hair and nail changes and vaginal dryness.  She is due for repeat DEXA in 09/2020, ordered today.  She continues calcium, vitamin D, and intermittent exercise.  She is interested in Prolia. Scr 1.09. no evidence of dental disease. She lives in [place] and prefers to do this with PCP, I will send a message to her.  If she is found to have worsening osteoporosis on next DEXA, we will likely recommend to switch to tamoxifen.  Annual left mammogram in 06/2021.  We reviewed signs and symptoms of recurrence including new bone/joint pain, abdominal pain, unexplained weight loss or fatigue.  She continues to follow-up with PCP and other age-appropriate cancer screenings.  She knows  to avoid smoking and alcohol.  She completed the COVID-19 vaccine series.  We will follow-up with her after the DEXA scan.  We will see her back for routine cancer surveillance in 6 months.   No problem-specific Assessment & Plan notes found for this encounter.   Orders Placed This Encounter  Procedures  . DG Bone Density    Standing Status:   Future    Standing Expiration Date:   06/16/2021    Order Specific Question:   Reason for Exam (SYMPTOM  OR DIAGNOSIS REQUIRED)    Answer:   osteoporosis, on AI    Order Specific Question:   Preferred imaging location?    Answer:   [facility]   All questions were answered. The patient knows to call the clinic with any problems, questions or concerns. No barriers to learning were detected.     Alla Feeling, NP 06/16/20

## 2020-06-16 ENCOUNTER — Inpatient Hospital Stay: Payer: Medicare Other | Attending: Nurse Practitioner

## 2020-06-16 ENCOUNTER — Other Ambulatory Visit: Payer: Self-pay

## 2020-06-16 ENCOUNTER — Telehealth: Payer: Self-pay

## 2020-06-16 ENCOUNTER — Telehealth: Payer: Self-pay | Admitting: Hematology

## 2020-06-16 ENCOUNTER — Encounter: Payer: Self-pay | Admitting: Nurse Practitioner

## 2020-06-16 ENCOUNTER — Inpatient Hospital Stay (HOSPITAL_BASED_OUTPATIENT_CLINIC_OR_DEPARTMENT_OTHER): Payer: Medicare Other | Admitting: Nurse Practitioner

## 2020-06-16 VITALS — BP 131/72 | HR 72 | Temp 97.3°F | Resp 16 | Wt 133.8 lb

## 2020-06-16 DIAGNOSIS — M810 Age-related osteoporosis without current pathological fracture: Secondary | ICD-10-CM | POA: Insufficient documentation

## 2020-06-16 DIAGNOSIS — C50311 Malignant neoplasm of lower-inner quadrant of right female breast: Secondary | ICD-10-CM

## 2020-06-16 DIAGNOSIS — Z170 Estrogen receptor positive status [ER+]: Secondary | ICD-10-CM | POA: Insufficient documentation

## 2020-06-16 DIAGNOSIS — Z79811 Long term (current) use of aromatase inhibitors: Secondary | ICD-10-CM | POA: Insufficient documentation

## 2020-06-16 LAB — CBC WITH DIFFERENTIAL/PLATELET
Abs Immature Granulocytes: 0.01 10*3/uL (ref 0.00–0.07)
Basophils Absolute: 0 10*3/uL (ref 0.0–0.1)
Basophils Relative: 0 %
Eosinophils Absolute: 0.1 10*3/uL (ref 0.0–0.5)
Eosinophils Relative: 2 %
HCT: 36.5 % (ref 36.0–46.0)
Hemoglobin: 11.9 g/dL — ABNORMAL LOW (ref 12.0–15.0)
Immature Granulocytes: 0 %
Lymphocytes Relative: 35 %
Lymphs Abs: 1.7 10*3/uL (ref 0.7–4.0)
MCH: 28.3 pg (ref 26.0–34.0)
MCHC: 32.6 g/dL (ref 30.0–36.0)
MCV: 86.7 fL (ref 80.0–100.0)
Monocytes Absolute: 0.4 10*3/uL (ref 0.1–1.0)
Monocytes Relative: 8 %
Neutro Abs: 2.7 10*3/uL (ref 1.7–7.7)
Neutrophils Relative %: 55 %
Platelets: 232 10*3/uL (ref 150–400)
RBC: 4.21 MIL/uL (ref 3.87–5.11)
RDW: 13.2 % (ref 11.5–15.5)
WBC: 4.9 10*3/uL (ref 4.0–10.5)
nRBC: 0 % (ref 0.0–0.2)

## 2020-06-16 LAB — COMPREHENSIVE METABOLIC PANEL
ALT: 16 U/L (ref 0–44)
AST: 20 U/L (ref 15–41)
Albumin: 3.9 g/dL (ref 3.5–5.0)
Alkaline Phosphatase: 76 U/L (ref 38–126)
Anion gap: 8 (ref 5–15)
BUN: 11 mg/dL (ref 8–23)
CO2: 26 mmol/L (ref 22–32)
Calcium: 9.1 mg/dL (ref 8.9–10.3)
Chloride: 108 mmol/L (ref 98–111)
Creatinine, Ser: 1.09 mg/dL — ABNORMAL HIGH (ref 0.44–1.00)
GFR calc Af Amer: >60 mL/min (ref >60)
GFR calc non Af Amer: 52 mL/min — ABNORMAL LOW (ref >60)
Glucose, Bld: 78 mg/dL (ref 70–99)
Potassium: 3.8 mmol/L (ref 3.5–5.1)
Sodium: 142 mmol/L (ref 135–145)
Total Bilirubin: 0.4 mg/dL (ref 0.3–1.2)
Total Protein: 7 g/dL (ref 6.5–8.1)

## 2020-06-16 MED ORDER — LETROZOLE 2.5 MG PO TABS: 2.5000 mg | ORAL_TABLET | Freq: Every day | ORAL | 1 refills | Status: DC

## 2020-06-16 NOTE — Telephone Encounter (Signed)
Scheduled per 07/15 los, patient will be notified per my chart.

## 2020-06-16 NOTE — Telephone Encounter (Signed)
Pt paperwork faxed to provider when calling to obtain fax number office staff suggest sending last completed labs.

## 2020-11-17 ENCOUNTER — Ambulatory Visit
Admission: RE | Admit: 2020-11-17 | Discharge: 2020-11-17 | Disposition: A | Discharge status: Home / Self Care | Payer: Medicare Other | Source: Ambulatory Visit | Attending: Nurse Practitioner | Admitting: Nurse Practitioner

## 2020-11-17 ENCOUNTER — Other Ambulatory Visit: Payer: Self-pay

## 2020-11-17 DIAGNOSIS — M810 Age-related osteoporosis without current pathological fracture: Secondary | ICD-10-CM

## 2020-12-09 ENCOUNTER — Other Ambulatory Visit: Payer: Self-pay | Admitting: Nurse Practitioner

## 2020-12-09 DIAGNOSIS — Z170 Estrogen receptor positive status [ER+]: Secondary | ICD-10-CM

## 2020-12-09 DIAGNOSIS — C50311 Malignant neoplasm of lower-inner quadrant of right female breast: Secondary | ICD-10-CM

## 2020-12-13 ENCOUNTER — Telehealth: Payer: Self-pay | Admitting: Hematology

## 2020-12-13 NOTE — Telephone Encounter (Signed)
Scheduled appt per 1/11 sch msg - pt is aware of new appt date and time

## 2020-12-15 ENCOUNTER — Ambulatory Visit: Payer: Medicare Other | Admitting: Hematology

## 2020-12-15 ENCOUNTER — Other Ambulatory Visit: Payer: Medicare Other
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
## 2021-01-09 ENCOUNTER — Inpatient Hospital Stay: Payer: Medicare Other | Admitting: Hematology

## 2021-01-09 ENCOUNTER — Inpatient Hospital Stay: Payer: Medicare Other

## 2021-01-09 DIAGNOSIS — C50311 Malignant neoplasm of lower-inner quadrant of right female breast: Secondary | ICD-10-CM

## 2021-01-25 NOTE — Progress Notes (Signed)
[place]   [phone] Fax:(336) [phone]   Clinic Follow up Note   Patient Care Team: Lester Alice Acres, MD as PCP - General (Family Medicine) Rolm Bookbinder, MD as Consulting Physician (General Surgery) Truitt Merle, MD as Consulting Physician (Hematology) Gardenia Phlegm, NP as Nurse Practitioner (Hematology and Oncology)  Date of Service:  01/26/2021  CHIEF COMPLAINT: F/u of right breast cancer   SUMMARY OF ONCOLOGIC HISTORY: Oncology History Overview Note  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 female breast [facility])   Staging form: Breast, AJCC 7th Edition   - Clinical stage from 05/10/2016: Stage 0 (Tis (DCIS), N0, cM0(i+)) - Signed by Truitt Merle, MD on 08/22/2016   - Pathologic stage from 08/02/2016: Stage IA (T1b, N0, cM0) - Signed by Truitt Merle, MD on 08/22/2016    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 female breast ([place])  05/01/2016 Mammogram   Diagnostic mammo showed a group microcalcification in the medial lower right breast measuring 3.8X3.3X2.6cm    05/10/2016 Initial Diagnosis   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 female breast ([place])   05/10/2016 Initial Biopsy   Right breast biopsy showed DCIS   05/10/2016 Receptors her2   ER 95%+, PR 5%+   06/19/2016 Imaging   Breast MRI showed extensive non-mass enhancement involving the entire lower right breast, 6.4X5.5X3.0cm, no adenopathy, left breast (-)    08/02/2016 Surgery   Right mastectomy and SLN biopsy    08/02/2016 Pathology Results   Right mastectomy showed invasive ductal carcinoma, G1, and high grade DCIS, DCIS was focally positive at posterior margin and broadly less than 0.1cm from the same posterior margin, 1 SLN was negative, nipple biopsy was negative    08/02/2016 Receptors her2   ER 100% strongly +, PR-, HER2-, Ki67 5%    09/02/2016 -  Anti-estrogen oral therapy   Letrozole 2.1m daily   11/01/2016 Genetic Testing   Genetic Testing  10/29/16 Negative for known pathogenic mutations within any of 42 genes on the [employer] Common Hereditary Cancers Panel (Breast, Gyn, GI).  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  The 42-gene [employer] Common Hereditary Cancers Panel (Breast, Gyn, GI) performed by [employer] includes sequencing and/or deletion/duplication analysis for the following genes: APC, ATM, AXIN2, BARD1, BMPR1A, BRCA1, BRCA2, BRIP1, CDH1, CDKN2A, CHEK2, DICER1, EPCAM, GREM1, KIT, MEN1, MLH1, MSH2, MSH6, MUTYH, NBN, NF1, PALB2, PDGFRA, PMS2, POLD1, POLE, PTEN, RAD50, RAD51C, RAD51D, SDHA, SDHB, SDHC, SDHD, SMAD4, SMARCA4, STK11, TP53, TSC1, TSC2, and VHL.  Date of report is October 29, 2016.    05/08/2018 Mammogram   05/08/2018 Mammogram IMPRESSION: No mammographic evidence of malignancy. A result letter of this screening mammogram will be mailed directly to the patient.      CURRENT THERAPY:  letrozole 2.557mdaily, started on 09/06/2016  INTERVAL HISTORY:  CaROZENA FIERROs here for a follow up of right breast cancer. She was last seen by virtual visit with me in 03/2019 and seen by NP Lacie in office 7 months ago in interim. She presents to the clinic alone. She is doing well overall, tolerating letrozole well. She is concern about her bone health, she is on calcium and vitD supplement  She takes care of her mother who is 8913o, does not have much time to exercise  She denies any pain, dyspnea, abdominal discomfort, or other symptoms. Appetite and energy weight is stable.  All other  systems were reviewed with the patient and are negative.  MEDICAL HISTORY:  Past Medical History:  Diagnosis Date  . Asthma    as child  . Cancer ([place])    DCIS right breast  . GERD (gastroesophageal reflux disease)   . Scoliosis     SURGICAL HISTORY: Past Surgical History:  Procedure Laterality Date  . ABDOMINAL  HYSTERECTOMY  1993  . AUGMENTATION MAMMAPLASTY Right 01/29/2017   Saline  . BREAST BIOPSY Right 05/10/2016   Stereo- Malignant  . BREAST RECONSTRUCTION WITH PLACEMENT OF TISSUE EXPANDER AND FLEX HD (ACELLULAR HYDRATED DERMIS) Right 08/02/2016   Procedure: RIGHT BREAST RECONSTRUCTION WITH PLACEMENT OF TISSUE EXPANDER;  Surgeon: Irene Limbo, MD;  Location: [place];  Service: Plastics;  Laterality: Right;  . MASTECTOMY Right 08/02/2016  . MASTOPEXY Left 01/29/2017   Procedure: MASTOPEXY;  Surgeon: Irene Limbo, MD;  Location: [place];  Service: Plastics;  Laterality: Left;  . NIPPLE SPARING MASTECTOMY/SENTINAL LYMPH NODE BIOPSY/RECONSTRUCTION/PLACEMENT OF TISSUE EXPANDER Right 08/02/2016   Procedure: RIGHT NIPPLE SPARING MASTECTOMY WITH SENTINAL LYMPH NODE BIOPSY;  Surgeon: Rolm Bookbinder, MD;  Location: [place];  Service: General;  Laterality: Right;  . REDUCTION MAMMAPLASTY Left   . REMOVAL OF TISSUE EXPANDER AND PLACEMENT OF IMPLANT Right 01/29/2017   Procedure: REMOVAL OF TISSUE EXPANDER AND PLACEMENT OF IMPLANT;  Surgeon: Irene Limbo, MD;  Location: [place];  Service: Plastics;  Laterality: Right;    I have reviewed the social history and family history with the patient and they are unchanged from previous note.  ALLERGIES:  is allergic to other and penicillins.  MEDICATIONS:  Current Outpatient Medications  Medication Sig Dispense Refill  . aspirin EC 81 MG tablet Take 81 mg by mouth daily.    . cyclobenzaprine (FLEXERIL) 10 MG tablet Take 10 mg by mouth 3 (three) times daily as needed for muscle spasms.    Marland Kitchen letrozole (FEMARA) 2.5 MG tablet TAKE 1 TABLET(2.5 MG) BY MOUTH DAILY 90 tablet 1  . Multiple Vitamin (MULTIVITAMIN WITH MINERALS) TABS tablet Take 1 tablet by mouth daily.    Marland Kitchen omeprazole (PRILOSEC) 40 MG capsule Take 40 mg by mouth daily.    . ranitidine (ZANTAC) 150 MG tablet Take 150 mg by  mouth 2 (two) times daily. (Patient not taking: Reported on 06/16/2020)     No current facility-administered medications for this visit.    PHYSICAL EXAMINATION: ECOG PERFORMANCE STATUS: 0 - Asymptomatic  Vitals:   01/26/21 1130  BP: (!) 151/84  Pulse: 77  Resp: 16  Temp: 97.9 F (36.6 C)  SpO2: 100%   Filed Weights   01/26/21 1130  Weight: 135 lb 6.4 oz (61.4 kg)    GENERAL:alert, no distress and comfortable SKIN: skin color, texture, turgor are normal, no rashes or significant lesions EYES: normal, Conjunctiva are pink and non-injected, sclera clear NECK: supple, thyroid normal size, non-tender, without nodularity LYMPH:  no palpable lymphadenopathy in the cervical, axillary  LUNGS: clear to auscultation and percussion with normal breathing effort HEART: regular rate & rhythm and no murmurs and no lower extremity edema ABDOMEN:abdomen soft, non-tender and normal bowel sounds Musculoskeletal:no cyanosis of digits and no clubbing  NEURO: alert & oriented x 3 with fluent speech, no focal motor/sensory deficits Breasts: Breast inspection showed status post right mastectomy.  No palpable mass along the implant or chest wall. Palpation of the left breast and axilla revealed no obvious mass that I could appreciate.   LABORATORY DATA:  I have reviewed the data as listed CBC Latest Ref Rng & Units 01/26/2021 06/16/2020 09/18/2018  WBC 4.0 - 10.5 K/uL 4.6 4.9 5.6  Hemoglobin 12.0 - 15.0 g/dL 11.8(L) 11.9(L) 12.7  Hematocrit 36.0 - 46.0 % 36.5 36.5 39.4  Platelets 150 - 400 K/uL 263 232 266     CMP Latest Ref Rng & Units 01/26/2021 06/16/2020 09/18/2018  Glucose 70 - 99 mg/dL 72 78 93  BUN 8 - 23 mg/dL _0 Creatinine 0.44 - 1.00 mg/dL 0.87 1.09(H) 0.88  Sodium 135 - 145 mmol/L 143 142 143  Potassium 3.5 - 5.1 mmol/L 3.8 3.8 4.4  Chloride 98 - 111 mmol/L 107 108 106  CO2 22 - 32 mmol/L _1 Calcium 8.9 - 10.3 mg/dL 9.4 9.1 9.4  Total Protein 6.5 - 8.1 g/dL 6.9 7.0  7.2  Total Bilirubin 0.3 - 1.2 mg/dL 0.4 0.4 0.3  Alkaline Phos 38 - 126 U/L 79 76 97  AST 15 - 41 U/L _2 ALT 0 - 44 U/L _3 RADIOGRAPHIC STUDIES: I have personally reviewed the radiological images as listed and agreed with the findings in the report. No results found.   ASSESSMENT & PLAN:  STORM DULSKI is a 70 y.o. female with   1. 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 She is s/p right mastectomy and SNLB. Given node negative disease, post-mastectomy radiation was not recommended.  -She started anti-estrogen therapy with adjuvant letrozolein 09/2016, tolerating well, we'll continue for 5 years.  -Her recent bone density scan showed osteoporosis, we discussed switching her letrozole to tamoxifen, which will strength her bones.  ---The potential side effects, which includes but not limited to, hot flash, skin and vaginal dryness, slightly increased risk of cardiovascular disease and cataract, small risk of thrombosis and endometrial cancer, were discussed with her in great details. Preventive strategies for thrombosis, such as being physically active, using compression stocks, avoid cigarette smoking, etc., were reviewed with her.  She has had a hysterectomy, no risk for endometrial cancer. She will think about it  -She prefers to continue letrozole for now -She is clinically doing well, lab reviewed, exam was normal, no clinical concern for recurrence -Continue breast cancer surveillance.  Next mammogram in July 2022   2. Genetic results from 10/29/16 were negative for known pathogenic mutations with (VUS) called "c.1243T>C (p.Ser415Pro)" was found in one copy of the PDGFRAgene.   3. Osteoporosis -Her prior outside DEXA showed osteopenia, with T score -1.6 at the distal one third of the left  forearm. -09/2018 DEXA showed osteoporosis with lowest T-Score of -2.5 at forearm radius.  -Her 11/2020 DEXA showed osteoporosis with T-score -3.4 at forearm radius.  -I recommend medical treatment for her osteoporosis.  I discussed the option of IV Zometa infusion every 6 months for 2 years, or Prolia injection every 6 months, or oral biphosphonate, for 3 to 5 years.  Benefit and potential side effects discussed with her in details, especially the risk of osteonecrosis of jaw.  I recommend IV Zometa, due to the potential benefit of reducing bone metastasis from her previous breast cancer.  -After lengthy discussion, she agrees to start Zometa in the next months.  She has no active dental issue, and has been seeing dentist routinely.  4. Benign Lytic  bone lesions seen on 01/2018 CT neck   Plan -start Zometa infusion next month, and continue every 6 months 2 years. -Lab and follow-up in 7 months -continue letrozole     No problem-specific Assessment & Plan notes found for this encounter.   No orders of the defined types were placed in this encounter.  All questions were answered. The patient knows to call the clinic with any problems, questions or concerns. No barriers to learning was detected. The total time spent in the appointment was 30 minutes.     Truitt Merle, MD 01/26/2021   I, Joslyn Devon, am acting as scribe for Truitt Merle, MD.   I have reviewed the above documentation for accuracy and completeness, and I agree with the above.

## 2021-01-26 ENCOUNTER — Inpatient Hospital Stay: Payer: Medicare Other | Attending: Hematology

## 2021-01-26 ENCOUNTER — Encounter: Payer: Self-pay | Admitting: Hematology

## 2021-01-26 ENCOUNTER — Other Ambulatory Visit: Payer: Self-pay

## 2021-01-26 ENCOUNTER — Inpatient Hospital Stay (HOSPITAL_BASED_OUTPATIENT_CLINIC_OR_DEPARTMENT_OTHER): Payer: Medicare Other | Admitting: Hematology

## 2021-01-26 ENCOUNTER — Telehealth: Payer: Self-pay | Admitting: Hematology

## 2021-01-26 VITALS — BP 151/84 | HR 77 | Temp 97.9°F | Resp 16 | Ht 60.0 in | Wt 135.4 lb

## 2021-01-26 DIAGNOSIS — C50311 Malignant neoplasm of lower-inner quadrant of right female breast: Secondary | ICD-10-CM | POA: Insufficient documentation

## 2021-01-26 DIAGNOSIS — M810 Age-related osteoporosis without current pathological fracture: Secondary | ICD-10-CM | POA: Diagnosis not present

## 2021-01-26 DIAGNOSIS — Z79811 Long term (current) use of aromatase inhibitors: Secondary | ICD-10-CM | POA: Insufficient documentation

## 2021-01-26 DIAGNOSIS — Z170 Estrogen receptor positive status [ER+]: Secondary | ICD-10-CM | POA: Insufficient documentation

## 2021-01-26 LAB — CBC WITH DIFFERENTIAL/PLATELET
Abs Immature Granulocytes: 0.01 10*3/uL (ref 0.00–0.07)
Basophils Absolute: 0 10*3/uL (ref 0.0–0.1)
Basophils Relative: 0 %
Eosinophils Absolute: 0.1 10*3/uL (ref 0.0–0.5)
Eosinophils Relative: 2 %
HCT: 36.5 % (ref 36.0–46.0)
Hemoglobin: 11.8 g/dL — ABNORMAL LOW (ref 12.0–15.0)
Immature Granulocytes: 0 %
Lymphocytes Relative: 31 %
Lymphs Abs: 1.4 10*3/uL (ref 0.7–4.0)
MCH: 27.9 pg (ref 26.0–34.0)
MCHC: 32.3 g/dL (ref 30.0–36.0)
MCV: 86.3 fL (ref 80.0–100.0)
Monocytes Absolute: 0.3 10*3/uL (ref 0.1–1.0)
Monocytes Relative: 7 %
Neutro Abs: 2.8 10*3/uL (ref 1.7–7.7)
Neutrophils Relative %: 60 %
Platelets: 263 10*3/uL (ref 150–400)
RBC: 4.23 MIL/uL (ref 3.87–5.11)
RDW: 13.1 % (ref 11.5–15.5)
WBC: 4.6 10*3/uL (ref 4.0–10.5)
nRBC: 0 % (ref 0.0–0.2)

## 2021-01-26 LAB — COMPREHENSIVE METABOLIC PANEL
ALT: 14 U/L (ref 0–44)
AST: 19 U/L (ref 15–41)
Albumin: 4 g/dL (ref 3.5–5.0)
Alkaline Phosphatase: 79 U/L (ref 38–126)
Anion gap: 9 (ref 5–15)
BUN: 14 mg/dL (ref 8–23)
CO2: 27 mmol/L (ref 22–32)
Calcium: 9.4 mg/dL (ref 8.9–10.3)
Chloride: 107 mmol/L (ref 98–111)
Creatinine, Ser: 0.87 mg/dL (ref 0.44–1.00)
GFR, Estimated: >60 mL/min (ref >60)
Glucose, Bld: 72 mg/dL (ref 70–99)
Potassium: 3.8 mmol/L (ref 3.5–5.1)
Sodium: 143 mmol/L (ref 135–145)
Total Bilirubin: 0.4 mg/dL (ref 0.3–1.2)
Total Protein: 6.9 g/dL (ref 6.5–8.1)

## 2021-01-26 NOTE — Telephone Encounter (Signed)
Scheduled appointments per 2/24 los. Spoke to patient who is aware of appointments dates and times. Gave patient calendar print out.

## 2021-02-23 ENCOUNTER — Inpatient Hospital Stay: Payer: Medicare Other | Attending: Hematology

## 2021-02-23 ENCOUNTER — Other Ambulatory Visit: Payer: Self-pay

## 2021-02-23 VITALS — BP 133/75 | HR 74 | Resp 18

## 2021-02-23 DIAGNOSIS — C50311 Malignant neoplasm of lower-inner quadrant of right female breast: Secondary | ICD-10-CM | POA: Diagnosis present

## 2021-02-23 DIAGNOSIS — M810 Age-related osteoporosis without current pathological fracture: Secondary | ICD-10-CM | POA: Insufficient documentation

## 2021-02-23 MED ORDER — ZOLEDRONIC ACID 4 MG/100ML IV SOLN
INTRAVENOUS | Status: AC
Start: 2021-02-23 — End: ?
  Filled 2021-02-23: qty 100

## 2021-02-23 MED ORDER — SODIUM CHLORIDE 0.9 % IV SOLN
Freq: Once | INTRAVENOUS | Status: AC
  Filled 2021-02-23: qty 250

## 2021-02-23 MED ORDER — ZOLEDRONIC ACID 4 MG/100ML IV SOLN
4.0000 mg | Freq: Once | INTRAVENOUS | Status: AC
  Administered 2021-02-23: 4 mg via INTRAVENOUS

## 2021-02-23 NOTE — Patient Instructions (Signed)
Zoledronic Acid Injection (Hypercalcemia, Oncology) What is this medicine? ZOLEDRONIC ACID (ZOE le dron ik AS id) slows calcium loss from bones. It high calcium levels in the blood from some kinds of cancer. It may be used in other people at risk for bone loss. This medicine may be used for other purposes; ask your health care provider or pharmacist if you have questions. COMMON BRAND NAME(S): Zometa What should I tell my health care provider before I take this medicine? They need to know if you have any of these conditions:  cancer  dehydration  dental disease  kidney disease  liver disease  low levels of calcium in the blood  lung or breathing disease (asthma)  receiving steroids like dexamethasone or prednisone  an unusual or allergic reaction to zoledronic acid, other medicines, foods, dyes, or preservatives  pregnant or trying to get pregnant  breast-feeding How should I use this medicine? This drug is injected into a vein. It is given by a health care provider in a hospital or clinic setting. Talk to your health care provider about the use of this drug in children. Special care may be needed. Overdosage: If you think you have taken too much of this medicine contact a poison control center or emergency room at once. NOTE: This medicine is only for you. Do not share this medicine with others. What if I miss a dose? Keep appointments for follow-up doses. It is important not to miss your dose. Call your health care provider if you are unable to keep an appointment. What may interact with this medicine?  certain antibiotics given by injection  NSAIDs, medicines for pain and inflammation, like ibuprofen or naproxen  some diuretics like bumetanide, furosemide  teriparatide  thalidomide This list may not describe all possible interactions. Give your health care provider a list of all the medicines, herbs, non-prescription drugs, or dietary supplements you use. Also tell  them if you smoke, drink alcohol, or use illegal drugs. Some items may interact with your medicine. What should I watch for while using this medicine? Visit your health care provider for regular checks on your progress. It may be some time before you see the benefit from this drug. Some people who take this drug have severe bone, joint, or muscle pain. This drug may also increase your risk for jaw problems or a broken thigh bone. Tell your health care provider right away if you have severe pain in your jaw, bones, joints, or muscles. Tell you health care provider if you have any pain that does not go away or that gets worse. Tell your dentist and dental surgeon that you are taking this drug. You should not have major dental surgery while on this drug. See your dentist to have a dental exam and fix any dental problems before starting this drug. Take good care of your teeth while on this drug. Make sure you see your dentist for regular follow-up appointments. You should make sure you get enough calcium and vitamin D while you are taking this drug. Discuss the foods you eat and the vitamins you take with your health care provider. Check with your health care provider if you have severe diarrhea, nausea, and vomiting, or if you sweat a lot. The loss of too much body fluid may make it dangerous for you to take this drug. You may need blood work done while you are taking this drug. Do not become pregnant while taking this drug. Women should inform their health care provider   if they wish to become pregnant or think they might be pregnant. There is potential for serious harm to an unborn child. Talk to your health care provider for more information. What side effects may I notice from receiving this medicine? Side effects that you should report to your doctor or health care provider as soon as possible:  allergic reactions (skin rash, itching or hives; swelling of the face, lips, or tongue)  bone  pain  infection (fever, chills, cough, sore throat, pain or trouble passing urine)  jaw pain, especially after dental work  joint pain  kidney injury (trouble passing urine or change in the amount of urine)  low blood pressure (dizziness; feeling faint or lightheaded, falls; unusually weak or tired)  low calcium levels (fast heartbeat; muscle cramps or pain; pain, tingling, or numbness in the hands or feet; seizures)  low magnesium levels (fast, irregular heartbeat; muscle cramp or pain; muscle weakness; tremors; seizures)  low red blood cell counts (trouble breathing; feeling faint; lightheaded, falls; unusually weak or tired)  muscle pain  redness, blistering, peeling, or loosening of the skin, including inside the mouth  severe diarrhea  swelling of the ankles, feet, hands  trouble breathing Side effects that usually do not require medical attention (report to your doctor or health care provider if they continue or are bothersome):  anxious  constipation  coughing  depressed mood  eye irritation, itching, or pain  fever  general ill feeling or flu-like symptoms  nausea  pain, redness, or irritation at site where injected  trouble sleeping This list may not describe all possible side effects. Call your doctor for medical advice about side effects. You may report side effects to FDA at 1-800-FDA-1088. Where should I keep my medicine? This drug is given in a hospital or clinic. It will not be stored at home. NOTE: This sheet is a summary. It may not cover all possible information. If you have questions about this medicine, talk to your doctor, pharmacist, or health care provider.  2021 Elsevier/Gold Standard (2019-09-03 09:13:00)  

## 2021-06-27 ENCOUNTER — Other Ambulatory Visit: Payer: Self-pay | Admitting: Nurse Practitioner

## 2021-06-27 DIAGNOSIS — C50311 Malignant neoplasm of lower-inner quadrant of right female breast: Secondary | ICD-10-CM

## 2021-08-31 ENCOUNTER — Other Ambulatory Visit: Payer: Self-pay

## 2021-08-31 ENCOUNTER — Encounter: Payer: Self-pay | Admitting: Hematology

## 2021-08-31 ENCOUNTER — Inpatient Hospital Stay: Payer: Medicare Other | Attending: Hematology

## 2021-08-31 ENCOUNTER — Inpatient Hospital Stay: Payer: Medicare Other

## 2021-08-31 ENCOUNTER — Inpatient Hospital Stay (HOSPITAL_BASED_OUTPATIENT_CLINIC_OR_DEPARTMENT_OTHER): Payer: Medicare Other | Admitting: Hematology

## 2021-08-31 VITALS — BP 138/79 | HR 70 | Temp 98.2°F | Resp 17 | Ht 60.0 in | Wt 145.3 lb

## 2021-08-31 DIAGNOSIS — Z79899 Other long term (current) drug therapy: Secondary | ICD-10-CM | POA: Diagnosis not present

## 2021-08-31 DIAGNOSIS — Z170 Estrogen receptor positive status [ER+]: Secondary | ICD-10-CM

## 2021-08-31 DIAGNOSIS — F419 Anxiety disorder, unspecified: Secondary | ICD-10-CM | POA: Diagnosis not present

## 2021-08-31 DIAGNOSIS — C50311 Malignant neoplasm of lower-inner quadrant of right female breast: Secondary | ICD-10-CM

## 2021-08-31 DIAGNOSIS — M810 Age-related osteoporosis without current pathological fracture: Secondary | ICD-10-CM | POA: Diagnosis not present

## 2021-08-31 LAB — CBC WITH DIFFERENTIAL/PLATELET
Abs Immature Granulocytes: 0.01 10*3/uL (ref 0.00–0.07)
Basophils Absolute: 0 10*3/uL (ref 0.0–0.1)
Basophils Relative: 1 %
Eosinophils Absolute: 0.1 10*3/uL (ref 0.0–0.5)
Eosinophils Relative: 1 %
HCT: 37.3 % (ref 36.0–46.0)
Hemoglobin: 12.4 g/dL (ref 12.0–15.0)
Immature Granulocytes: 0 %
Lymphocytes Relative: 33 %
Lymphs Abs: 1.9 10*3/uL (ref 0.7–4.0)
MCH: 28.8 pg (ref 26.0–34.0)
MCHC: 33.2 g/dL (ref 30.0–36.0)
MCV: 86.5 fL (ref 80.0–100.0)
Monocytes Absolute: 0.4 10*3/uL (ref 0.1–1.0)
Monocytes Relative: 8 %
Neutro Abs: 3.2 10*3/uL (ref 1.7–7.7)
Neutrophils Relative %: 57 %
Platelets: 238 10*3/uL (ref 150–400)
RBC: 4.31 MIL/uL (ref 3.87–5.11)
RDW: 13.3 % (ref 11.5–15.5)
WBC: 5.7 10*3/uL (ref 4.0–10.5)
nRBC: 0 % (ref 0.0–0.2)

## 2021-08-31 LAB — COMPREHENSIVE METABOLIC PANEL
ALT: 18 U/L (ref 0–44)
AST: 20 U/L (ref 15–41)
Albumin: 4 g/dL (ref 3.5–5.0)
Alkaline Phosphatase: 59 U/L (ref 38–126)
Anion gap: 11 (ref 5–15)
BUN: 11 mg/dL (ref 8–23)
CO2: 23 mmol/L (ref 22–32)
Calcium: 9 mg/dL (ref 8.9–10.3)
Chloride: 105 mmol/L (ref 98–111)
Creatinine, Ser: 0.93 mg/dL (ref 0.44–1.00)
GFR, Estimated: >60 mL/min (ref >60)
Glucose, Bld: 93 mg/dL (ref 70–99)
Potassium: 3.9 mmol/L (ref 3.5–5.1)
Sodium: 139 mmol/L (ref 135–145)
Total Bilirubin: 0.4 mg/dL (ref 0.3–1.2)
Total Protein: 6.8 g/dL (ref 6.5–8.1)

## 2021-08-31 MED ORDER — SODIUM CHLORIDE 0.9 % IV SOLN: Freq: Once | INTRAVENOUS | Status: AC

## 2021-08-31 MED ORDER — ZOLEDRONIC ACID 4 MG/100ML IV SOLN
4.0000 mg | Freq: Once | INTRAVENOUS | Status: AC
  Administered 2021-08-31: 4 mg via INTRAVENOUS
  Filled 2021-08-31: qty 100

## 2021-08-31 NOTE — Progress Notes (Signed)
[place]   Telephone:(336) [phone] Fax:(336) [phone]   Clinic Follow up Note   Patient Care Team: Lester Endwell, MD as PCP - General (Family Medicine) Rolm Bookbinder, MD as Consulting Physician (General Surgery) Truitt Merle, MD as Consulting Physician (Hematology) Delice Bison Charlestine Massed, NP as Nurse Practitioner (Hematology and Oncology)  Date of Service:  08/31/2021  CHIEF COMPLAINT: f/u of right breast cancer  CURRENT THERAPY:  letrozole 2.$RemoveBefo'5mg'hGiWgQYZwEY$  daily, started on 09/06/2016  ASSESSMENT & PLAN:  Hayley Marks is a 70 y.o. female with   1. 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 She is s/p right mastectomy and SNLB. Given node negative disease, post-mastectomy radiation was not recommended.  -She started anti-estrogen therapy with adjuvant letrozole in 09/2016, tolerating relatively well. She has completed 5 years of treatment and will finish off what she already has. -She will continue Zometa for one more year. -She is clinically doing well, lab reviewed, exam was normal, no clinical concern for recurrence -Continue breast cancer surveillance.     2. Genetic results from 10/29/16 were negative for known pathogenic mutations with (VUS) called "c.1243T>C (p.Ser415Pro)" was found in one copy of the PDGFRA gene.     3. Osteoporosis -09/2018 DEXA showed osteoporosis with lowest T-Score of -2.5 at forearm radius.  -Her 11/2020 DEXA showed osteoporosis with T-score -3.4 at forearm radius. Will repeat in 2-3 years. -She began Zometa on 02/23/21. She will receive her second dose today.   4. Benign Lytic bone lesions seen on 01/2018 CT neck     Plan -she will complete 5 years of letrozole next month then stop letrozole after  -Zometa infusion today -lab and Zometa in 6 and 12 months -f/u with NP Lacie in 1  year   No problem-specific Assessment & Plan notes found for this encounter.   SUMMARY OF ONCOLOGIC HISTORY: Oncology History Overview Note  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 female breast [facility])   Staging form: Breast, AJCC 7th Edition   - Clinical stage from 05/10/2016: Stage 0 (Tis (DCIS), N0, cM0(i+)) - Signed by Truitt Merle, MD on 08/22/2016   - Pathologic stage from 08/02/2016: Stage IA (T1b, N0, cM0) - Signed by Truitt Merle, MD on 08/22/2016    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 female breast ([place])  05/01/2016 Mammogram   Diagnostic mammo showed a group microcalcification in the medial lower right breast measuring 3.8X3.3X2.6cm    05/10/2016 Initial Diagnosis   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 female breast ([place])   05/10/2016 Initial Biopsy   Right breast biopsy showed DCIS   05/10/2016 Receptors her2   ER 95%+, PR 5%+   06/19/2016 Imaging   Breast MRI showed extensive non-mass enhancement involving the entire lower right breast, 6.4X5.5X3.0cm, no adenopathy, left breast (-)    08/02/2016 Surgery   Right mastectomy and SLN biopsy    08/02/2016 Pathology Results   Right mastectomy showed invasive ductal carcinoma, G1, and high grade DCIS, DCIS was focally positive at posterior margin and broadly less than 0.1cm from the same posterior margin, 1 SLN was negative, nipple biopsy was negative    08/02/2016 Receptors her2   ER 100% strongly +, PR-, HER2-, Ki67 5%    09/02/2016 -  Anti-estrogen oral therapy   Letrozole 2.$RemoveBefo'5mg'mZzSnvLuYzr$  daily   11/01/2016 Genetic Testing   Genetic Testing 10/29/16  Negative for known pathogenic mutations within any of 42 genes on the Invitae Common Hereditary Cancers Panel (Breast, Gyn, GI).  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  The 42-gene Inv[facility]ae Common Hereditary Cancers Panel (Breast, Gyn, GI) performed by [facility], [place])  includes sequencing and/or deletion/duplication analysis for the following genes: APC, ATM, AXIN2, BARD1, BMPR1A, BRCA1, BRCA2, BRIP1, CDH1, CDKN2A, CHEK2, DICER1, EPCAM, GREM1, KIT, MEN1, MLH1, MSH2, MSH6, MUTYH, NBN, NF1, PALB2, PDGFRA, PMS2, POLD1, POLE, PTEN, RAD50, RAD51C, RAD51D, SDHA, SDHB, SDHC, SDHD, SMAD4, SMARCA4, STK11, TP53, TSC1, TSC2, and VHL.  Date of report is October 29, 2016.    05/08/2018 Mammogram   05/08/2018 Mammogram IMPRESSION: No mammographic evidence of malignancy. A result letter of this screening mammogram will be mailed directly to the patient.      INTERVAL HISTORY:  Hayley Marks is here for a follow up of breast cancer. She was last seen by me on 01/26/21. She presents to the clinic alone. She reports her best friend died in 2023/02/17. She notes she developed some anxiety following this. She reports she is feeling much better. She reports vaginal dryness from the letrozole. "I feel like it's taken away my femininity."   All other systems were reviewed with the patient and are negative.  MEDICAL HISTORY:  Past Medical History:  Diagnosis Date   Asthma    as child   Cancer ([place])    DCIS right breast   GERD (gastroesophageal reflux disease)    Scoliosis     SURGICAL HISTORY: Past Surgical History:  Procedure Laterality Date   ABDOMINAL HYSTERECTOMY  1993   AUGMENTATION MAMMAPLASTY Right 01/29/2017   [place]   BREAST BIOPSY Right 05/10/2016   Stereo- Malignant   BREAST RECONSTRUCTION WITH PLACEMENT OF TISSUE EXPANDER AND FLEX HD (ACELLULAR HYDRATED DERMIS) Right 08/02/2016   Procedure: RIGHT BREAST RECONSTRUCTION WITH PLACEMENT OF TISSUE EXPANDER;  Surgeon: Irene Limbo, MD;  Location: [place];  Service: Plastics;  Laterality: Right;   MASTECTOMY Right 08/02/2016   MASTOPEXY Left 01/29/2017   Procedure: MASTOPEXY;  Surgeon: Irene Limbo, MD;  Location: [place];  Service: Plastics;  Laterality: Left;   NIPPLE  SPARING MASTECTOMY/SENTINAL LYMPH NODE BIOPSY/RECONSTRUCTION/PLACEMENT OF TISSUE EXPANDER Right 08/02/2016   Procedure: RIGHT NIPPLE SPARING MASTECTOMY WITH SENTINAL LYMPH NODE BIOPSY;  Surgeon: Rolm Bookbinder, MD;  Location: [place];  Service: General;  Laterality: Right;   REDUCTION MAMMAPLASTY Left    REMOVAL OF TISSUE EXPANDER AND PLACEMENT OF IMPLANT Right 01/29/2017   Procedure: REMOVAL OF TISSUE EXPANDER AND PLACEMENT OF IMPLANT;  Surgeon: Irene Limbo, MD;  Location: [place];  Service: Plastics;  Laterality: Right;    I have reviewed the social history and family history with the patient and they are unchanged from previous note.  ALLERGIES:  is allergic to other and penicillins.  MEDICATIONS:  Current Outpatient Medications  Medication Sig Dispense Refill   aspirin EC 81 MG tablet Take 81 mg by mouth daily.     cyclobenzaprine (FLEXERIL) 10 MG tablet Take 10 mg by mouth 3 (three) times daily as needed for muscle spasms.     escitalopram (LEXAPRO) 10 MG tablet Take 10 mg by mouth daily.     letrozole (FEMARA) 2.5 MG tablet TAKE 1 TABLET(2.5 MG) BY MOUTH DAILY 90 tablet 1   Multiple Vitamin (MULTIVITAMIN WITH MINERALS) TABS tablet Take 1 tablet by mouth daily.  omeprazole (PRILOSEC) 40 MG capsule Take 40 mg by mouth daily.     No current facility-administered medications for this visit.    PHYSICAL EXAMINATION: ECOG PERFORMANCE STATUS: 0 - Asymptomatic  Vitals:   08/31/21 1120  BP: 138/79  Pulse: 70  Resp: 17  Temp: 98.2 F (36.8 C)  SpO2: 99%   Wt Readings from Last 3 Encounters:  08/31/21 145 lb 4.8 oz (65.9 kg)  01/26/21 135 lb 6.4 oz (61.4 kg)  06/16/20 133 lb 12.8 oz (60.7 kg)     GENERAL:alert, no distress and comfortable SKIN: skin color, texture, turgor are normal, no rashes or significant lesions EYES: normal, Conjunctiva are pink and non-injected, sclera clear  NECK: supple, thyroid normal size, non-tender,  without nodularity LYMPH:  no palpable lymphadenopathy in the cervical, axillary  LUNGS: clear to auscultation and percussion with normal breathing effort HEART: regular rate & rhythm and no murmurs and no lower extremity edema ABDOMEN:abdomen soft, non-tender and normal bowel sounds Musculoskeletal:no cyanosis of digits and no clubbing  NEURO: alert & oriented x 3 with fluent speech, no focal motor/sensory deficits BREAST: No palpable mass, nodules or adenopathy bilaterally. Breast exam benign.   LABORATORY DATA:  I have reviewed the data as listed CBC Latest Ref Rng & Units 08/31/2021 01/26/2021 06/16/2020  WBC 4.0 - 10.5 K/uL 5.7 4.6 4.9  Hemoglobin 12.0 - 15.0 g/dL 12.4 11.8(L) 11.9(L)  Hematocrit 36.0 - 46.0 % 37.3 36.5 36.5  Platelets 150 - 400 K/uL 238 263 232     CMP Latest Ref Rng & Units 01/26/2021 06/16/2020 09/18/2018  Glucose 70 - 99 mg/dL 72 78 93  BUN 8 - 23 mg/dL $Remove'14 11 13  'ZRQDOES$ Creatinine 0.44 - 1.00 mg/dL 0.87 1.09(H) 0.88  Sodium 135 - 145 mmol/L 143 142 143  Potassium 3.5 - 5.1 mmol/L 3.8 3.8 4.4  Chloride 98 - 111 mmol/L 107 108 106  CO2 22 - 32 mmol/L $RemoveB'27 26 27  'tEIoNSAh$ Calcium 8.9 - 10.3 mg/dL 9.4 9.1 9.4  Total Protein 6.5 - 8.1 g/dL 6.9 7.0 7.2  Total Bilirubin 0.3 - 1.2 mg/dL 0.4 0.4 0.3  Alkaline Phos 38 - 126 U/L 79 76 97  AST 15 - 41 U/L $Remo'19 20 22  'OfgwX$ ALT 0 - 44 U/L $Remo'14 16 21      'RmOKu$ RADIOGRAPHIC STUDIES: I have personally reviewed the radiological images as listed and agreed with the findings in the report. No results found.    No orders of the defined types were placed in this encounter.  All questions were answered. The patient knows to call the clinic with any problems, questions or concerns. No barriers to learning was detected. The total time spent in the appointment was 30 minutes.     Truitt Merle, MD 08/31/2021   I, Wilburn Mylar, am acting as scribe for Truitt Merle, MD.   I have reviewed the above documentation for accuracy and completeness, and I agree  with the above.

## 2021-08-31 NOTE — Patient Instructions (Signed)

## 2022-03-01 ENCOUNTER — Inpatient Hospital Stay: Payer: Medicare Other

## 2022-03-22 ENCOUNTER — Other Ambulatory Visit: Payer: Self-pay

## 2022-03-22 ENCOUNTER — Inpatient Hospital Stay: Payer: Medicare Other

## 2022-03-22 ENCOUNTER — Inpatient Hospital Stay: Payer: Medicare Other | Attending: Nurse Practitioner

## 2022-03-22 VITALS — BP 142/74 | HR 75 | Temp 98.1°F | Resp 17

## 2022-03-22 DIAGNOSIS — Z79899 Other long term (current) drug therapy: Secondary | ICD-10-CM | POA: Diagnosis not present

## 2022-03-22 DIAGNOSIS — C50311 Malignant neoplasm of lower-inner quadrant of right female breast: Secondary | ICD-10-CM | POA: Insufficient documentation

## 2022-03-22 DIAGNOSIS — M810 Age-related osteoporosis without current pathological fracture: Secondary | ICD-10-CM | POA: Diagnosis not present

## 2022-03-22 DIAGNOSIS — Z170 Estrogen receptor positive status [ER+]: Secondary | ICD-10-CM | POA: Diagnosis not present

## 2022-03-22 LAB — COMPREHENSIVE METABOLIC PANEL
ALT: 19 U/L (ref 0–44)
AST: 22 U/L (ref 15–41)
Albumin: 4 g/dL (ref 3.5–5.0)
Alkaline Phosphatase: 67 U/L (ref 38–126)
Anion gap: 4 — ABNORMAL LOW (ref 5–15)
BUN: 10 mg/dL (ref 8–23)
CO2: 28 mmol/L (ref 22–32)
Calcium: 9 mg/dL (ref 8.9–10.3)
Chloride: 109 mmol/L (ref 98–111)
Creatinine, Ser: 0.8 mg/dL (ref 0.44–1.00)
GFR, Estimated: >60 mL/min (ref >60)
Glucose, Bld: 101 mg/dL — ABNORMAL HIGH (ref 70–99)
Potassium: 3.8 mmol/L (ref 3.5–5.1)
Sodium: 141 mmol/L (ref 135–145)
Total Bilirubin: 0.3 mg/dL (ref 0.3–1.2)
Total Protein: 7 g/dL (ref 6.5–8.1)

## 2022-03-22 LAB — CBC WITH DIFFERENTIAL/PLATELET
Abs Immature Granulocytes: 0.01 10*3/uL (ref 0.00–0.07)
Basophils Absolute: 0 10*3/uL (ref 0.0–0.1)
Basophils Relative: 0 %
Eosinophils Absolute: 0.3 10*3/uL (ref 0.0–0.5)
Eosinophils Relative: 6 %
HCT: 37.4 % (ref 36.0–46.0)
Hemoglobin: 12.4 g/dL (ref 12.0–15.0)
Immature Granulocytes: 0 %
Lymphocytes Relative: 36 %
Lymphs Abs: 1.7 10*3/uL (ref 0.7–4.0)
MCH: 28.6 pg (ref 26.0–34.0)
MCHC: 33.2 g/dL (ref 30.0–36.0)
MCV: 86.4 fL (ref 80.0–100.0)
Monocytes Absolute: 0.4 10*3/uL (ref 0.1–1.0)
Monocytes Relative: 8 %
Neutro Abs: 2.4 10*3/uL (ref 1.7–7.7)
Neutrophils Relative %: 50 %
Platelets: 273 10*3/uL (ref 150–400)
RBC: 4.33 MIL/uL (ref 3.87–5.11)
RDW: 13.5 % (ref 11.5–15.5)
WBC: 4.7 10*3/uL (ref 4.0–10.5)
nRBC: 0 % (ref 0.0–0.2)

## 2022-03-22 MED ORDER — ZOLEDRONIC ACID 4 MG/100ML IV SOLN
4.0000 mg | Freq: Once | INTRAVENOUS | Status: AC
  Administered 2022-03-22: 4 mg via INTRAVENOUS
  Filled 2022-03-22: qty 100

## 2022-03-22 MED ORDER — SODIUM CHLORIDE 0.9 % IV SOLN: Freq: Once | INTRAVENOUS | Status: AC

## 2022-03-22 NOTE — Patient Instructions (Signed)

## 2022-08-30 ENCOUNTER — Encounter: Payer: Self-pay | Admitting: Nurse Practitioner

## 2022-08-30 ENCOUNTER — Inpatient Hospital Stay: Payer: Medicare Other | Attending: Nurse Practitioner | Admitting: Nurse Practitioner

## 2022-08-30 ENCOUNTER — Other Ambulatory Visit: Payer: Self-pay

## 2022-08-30 ENCOUNTER — Inpatient Hospital Stay: Payer: Medicare Other

## 2022-08-30 ENCOUNTER — Ambulatory Visit (HOSPITAL_COMMUNITY)
Admission: RE | Admit: 2022-08-30 | Discharge: 2022-08-30 | Disposition: A | Discharge status: Home / Self Care | Payer: Medicare Other | Source: Ambulatory Visit | Attending: Nurse Practitioner | Admitting: Nurse Practitioner

## 2022-08-30 VITALS — BP 150/87 | HR 66 | Temp 97.9°F | Resp 15 | Wt 142.5 lb

## 2022-08-30 DIAGNOSIS — C50311 Malignant neoplasm of lower-inner quadrant of right female breast: Secondary | ICD-10-CM | POA: Diagnosis present

## 2022-08-30 DIAGNOSIS — M25552 Pain in left hip: Secondary | ICD-10-CM

## 2022-08-30 DIAGNOSIS — Z79811 Long term (current) use of aromatase inhibitors: Secondary | ICD-10-CM | POA: Insufficient documentation

## 2022-08-30 DIAGNOSIS — Z170 Estrogen receptor positive status [ER+]: Secondary | ICD-10-CM | POA: Diagnosis not present

## 2022-08-30 DIAGNOSIS — M25561 Pain in right knee: Secondary | ICD-10-CM

## 2022-08-30 DIAGNOSIS — M810 Age-related osteoporosis without current pathological fracture: Secondary | ICD-10-CM | POA: Insufficient documentation

## 2022-08-30 DIAGNOSIS — E2839 Other primary ovarian failure: Secondary | ICD-10-CM | POA: Diagnosis not present

## 2022-08-30 LAB — COMPREHENSIVE METABOLIC PANEL
ALT: 21 U/L (ref 0–44)
AST: 24 U/L (ref 15–41)
Albumin: 4.3 g/dL (ref 3.5–5.0)
Alkaline Phosphatase: 61 U/L (ref 38–126)
Anion gap: 8 (ref 5–15)
BUN: 14 mg/dL (ref 8–23)
CO2: 29 mmol/L (ref 22–32)
Calcium: 9 mg/dL (ref 8.9–10.3)
Chloride: 103 mmol/L (ref 98–111)
Creatinine, Ser: 0.78 mg/dL (ref 0.44–1.00)
GFR, Estimated: >60 mL/min (ref >60)
Glucose, Bld: 78 mg/dL (ref 70–99)
Potassium: 4.5 mmol/L (ref 3.5–5.1)
Sodium: 140 mmol/L (ref 135–145)
Total Bilirubin: 0.3 mg/dL (ref 0.3–1.2)
Total Protein: 7.5 g/dL (ref 6.5–8.1)

## 2022-08-30 LAB — CBC WITH DIFFERENTIAL/PLATELET
Abs Immature Granulocytes: 0 10*3/uL (ref 0.00–0.07)
Basophils Absolute: 0 10*3/uL (ref 0.0–0.1)
Basophils Relative: 0 %
Eosinophils Absolute: 0.1 10*3/uL (ref 0.0–0.5)
Eosinophils Relative: 2 %
HCT: 38.8 % (ref 36.0–46.0)
Hemoglobin: 13.1 g/dL (ref 12.0–15.0)
Immature Granulocytes: 0 %
Lymphocytes Relative: 43 %
Lymphs Abs: 2.2 10*3/uL (ref 0.7–4.0)
MCH: 29 pg (ref 26.0–34.0)
MCHC: 33.8 g/dL (ref 30.0–36.0)
MCV: 85.8 fL (ref 80.0–100.0)
Monocytes Absolute: 0.6 10*3/uL (ref 0.1–1.0)
Monocytes Relative: 12 %
Neutro Abs: 2.2 10*3/uL (ref 1.7–7.7)
Neutrophils Relative %: 43 %
Platelets: 235 10*3/uL (ref 150–400)
RBC: 4.52 MIL/uL (ref 3.87–5.11)
RDW: 13.4 % (ref 11.5–15.5)
WBC: 5.1 10*3/uL (ref 4.0–10.5)
nRBC: 0 % (ref 0.0–0.2)

## 2022-08-30 MED ORDER — ZOLEDRONIC ACID 4 MG/100ML IV SOLN
4.0000 mg | Freq: Once | INTRAVENOUS | Status: DC
  Filled 2022-08-30: qty 100

## 2022-08-30 MED ORDER — SODIUM CHLORIDE 0.9 % IV SOLN: Freq: Once | INTRAVENOUS | Status: DC

## 2022-08-30 NOTE — Progress Notes (Signed)
[place]   Telephone:(336) [phone] Fax:(336) [phone]   Clinic Follow up Note   Patient Care Team: Hayley [place], MD as PCP - General (Family Medicine) Hayley Bookbinder, MD as Consulting Physician (General Surgery) Hayley Merle, MD as Consulting Physician (Hematology) Hayley Phlegm, NP as Nurse Practitioner (Hematology and Oncology) 08/30/2022  CHIEF COMPLAINT: Follow-up right breast cancer  SUMMARY OF ONCOLOGIC HISTORY: Oncology History Overview Note  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 female breast [facility])   Staging form: Breast, AJCC 7th Edition   - Clinical stage from 05/10/2016: Stage 0 (Tis (DCIS), N0, cM0(i+)) - Signed by Hayley Merle, MD on 08/22/2016   - Pathologic stage from 08/02/2016: Stage IA (T1b, N0, cM0) - Signed by Hayley Merle, MD on 08/22/2016    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 female breast ([place])  05/01/2016 Mammogram   Diagnostic mammo showed a group microcalcification in the medial lower right breast measuring 3.8X3.3X2.6cm    05/10/2016 Initial Diagnosis   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 female breast ([place])   05/10/2016 Initial Biopsy   Right breast biopsy showed DCIS   05/10/2016 Receptors her2   ER 95%+, PR 5%+   06/19/2016 Imaging   Breast MRI showed extensive non-mass enhancement involving the entire lower right breast, 6.4X5.5X3.0cm, no adenopathy, left breast (-)    08/02/2016 Surgery   Right mastectomy and SLN biopsy    08/02/2016 Pathology Results   Right mastectomy showed invasive ductal carcinoma, G1, and high grade DCIS, DCIS was focally positive at posterior margin and broadly less than 0.1cm from the same posterior margin, 1 SLN was negative, nipple biopsy was negative    08/02/2016 Receptors her2   ER 100% strongly +, PR-, HER2-, Ki67 5%    09/02/2016 -  Anti-estrogen oral therapy   Letrozole 2.41m daily   11/01/2016 Genetic Testing   Genetic Testing 10/29/16 Negative for known  pathogenic mutations within any of 42 genes on the Invitae Common Hereditary Cancers Panel (Breast, Gyn, GI).  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  The 42-gene [facility]ae Common Hereditary Cancers Panel (Breast, Gyn, GI) [facility] [employer] [place] includes sequencing and/or deletion/duplication analysis for the following genes: APC, ATM, AXIN2, BARD1, BMPR1A, BRCA1, BRCA2, BRIP1, CDH1, CDKN2A, CHEK2, DICER1, EPCAM, GREM1, KIT, MEN1, MLH1, MSH2, MSH6, MUTYH, NBN, NF1, PALB2, PDGFRA, PMS2, POLD1, POLE, PTEN, RAD50, RAD51C, RAD51D, SDHA, SDHB, SDHC, SDHD, SMAD4, SMARCA4, STK11, TP53, TSC1, TSC2, and VHL.  Date of report is October 29, 2016.    05/08/2018 Mammogram   05/08/2018 Mammogram IMPRESSION: No mammographic evidence of malignancy. A result letter of this screening mammogram will be mailed directly to the patient.     CURRENT THERAPY: Letrozole 2.5 mg daily starting 09/06/2016, completed 09/2021  INTERVAL HISTORY: [name] for follow-up as scheduled, last seen by Dr. FBurr Medico9/29/2022.  She completed letrozole shortly after.  Last Zometa 03/22/2022.  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  He is concerned and would like this evaluated.  Aleve helps but uses with caution. She is under some stress with her mother being  in assisted living. Otherwise doing well without specific complaints or concerns in her breast such as new lump/mass, nipple discharge or inversion, or skin change.  All other systems were reviewed with the patient and are negative.  MEDICAL HISTORY:  Past Medical History:  Diagnosis  Date   Asthma    as child   Cancer ([place])    DCIS right breast   GERD (gastroesophageal reflux disease)    Scoliosis     SURGICAL HISTORY: Past Surgical History:  Procedure Laterality Date   ABDOMINAL HYSTERECTOMY  1993   AUGMENTATION MAMMAPLASTY Right 01/29/2017   Saline   BREAST BIOPSY Right 05/10/2016   Stereo- Malignant   BREAST RECONSTRUCTION WITH PLACEMENT OF TISSUE EXPANDER AND FLEX HD (ACELLULAR HYDRATED DERMIS) Right 08/02/2016   Procedure: RIGHT BREAST RECONSTRUCTION WITH PLACEMENT OF TISSUE EXPANDER;  Surgeon: Hayley Limbo, MD;  Location: [place];  Service: Plastics;  Laterality: Right;   MASTECTOMY Right 08/02/2016   MASTOPEXY Left 01/29/2017   Procedure: MASTOPEXY;  Surgeon: Hayley Limbo, MD;  Location: [place];  Service: Plastics;  Laterality: Left;   NIPPLE SPARING MASTECTOMY/SENTINAL LYMPH NODE BIOPSY/RECONSTRUCTION/PLACEMENT OF TISSUE EXPANDER Right 08/02/2016   Procedure: RIGHT NIPPLE SPARING MASTECTOMY WITH SENTINAL LYMPH NODE BIOPSY;  Surgeon: Hayley Bookbinder, MD;  Location: [place];  Service: General;  Laterality: Right;   REDUCTION MAMMAPLASTY Left    REMOVAL OF TISSUE EXPANDER AND PLACEMENT OF IMPLANT Right 01/29/2017   Procedure: REMOVAL OF TISSUE EXPANDER AND PLACEMENT OF IMPLANT;  Surgeon: Hayley Limbo, MD;  Location: [place];  Service: Plastics;  Laterality: Right;    I have reviewed the social history and family history with the patient and they are unchanged from previous note.  ALLERGIES:  is allergic to other and penicillins.  MEDICATIONS:  Current Outpatient Medications  Medication Sig Dispense Refill   aspirin EC 81 MG tablet Take 81 mg by mouth daily.     cyclobenzaprine (FLEXERIL) 10 MG tablet Take 10 mg by mouth 3 (three) times daily as needed for muscle spasms.     Influenza Vac Typ A&B Surf Ant (FLUVIRIN IM) Fluvirin 2015-2016 45 mcg (15 mcg x 3)/0.5 mL  intramuscular suspension     Multiple Vitamin (MULTIVITAMIN WITH MINERALS) TABS tablet Take 1 tablet by mouth daily.     omeprazole (PRILOSEC) 40 MG capsule Take 40 mg by mouth daily.     Zoster Vaccine Adjuvanted [facility]) injection Shingrix (PF) 50 mcg/0.5 mL intramuscular suspension, kit  PHARMACIST TO INJECT     No current facility-administered medications for this visit.    PHYSICAL EXAMINATION: ECOG PERFORMANCE STATUS: 1 - Symptomatic but completely ambulatory  Vitals:   08/30/22 1041 08/30/22 1118  BP: (!) 161/95 (!) 150/87  Pulse: 66   Resp: 15   Temp: 97.9 F (36.6 C)   SpO2: 100%    Filed Weights   08/30/22 1041  Weight: 142 lb 8 oz (64.6 kg)    GENERAL:alert, no distress and comfortable SKIN: no rash  EYES: sclera clear NECK: without mass LYMPH:  no palpable cervical or supraclavicular lymphadenopathy  LUNGS: clear with normal breathing effort HEART: regular rate & rhythm, no lower extremity edema ABDOMEN: abdomen soft, non-tender and normal bowel sounds Musculoskeletal: No focal tenderness NEURO: alert & oriented x 3 with fluent speech, no focal motor/sensory deficits Breast exam: No bilateral nipple discharge or inversion.  S/p right mastectomy and bilateral reconstruction, incisions completely healed.  No palpable mass or nodularity in either  breast, chest wall, or axilla that I could appreciate.  LABORATORY DATA:  I have reviewed the data as listed    Latest Ref Rng & Units 08/30/2022   10:19 AM 03/22/2022   12:11 PM 08/31/2021   11:05 AM  CBC  WBC 4.0 - 10.5 K/uL 5.1  4.7  5.7   Hemoglobin 12.0 - 15.0 g/dL 13.1  12.4  12.4   Hematocrit 36.0 - 46.0 % 38.8  37.4  37.3   Platelets 150 - 400 K/uL 235  273  238         Latest Ref Rng & Units 08/30/2022   10:19 AM 03/22/2022   12:11 PM 08/31/2021   11:05 AM  CMP  Glucose 70 - 99 mg/dL 78  101  93   BUN 8 - 23 mg/dL _0 Creatinine 0.44 - 1.00 mg/dL 0.78  0.80  0.93   Sodium 135 - 145 mmol/L  140  141  139   Potassium 3.5 - 5.1 mmol/L 4.5  3.8  3.9   Chloride 98 - 111 mmol/L 103  109  105   CO2 22 - 32 mmol/L _1 Calcium 8.9 - 10.3 mg/dL 9.0  9.0  9.0   Total Protein 6.5 - 8.1 g/dL 7.5  7.0  6.8   Total Bilirubin 0.3 - 1.2 mg/dL 0.3  0.3  0.4   Alkaline Phos 38 - 126 U/L 61  67  59   AST 15 - 41 U/L _2 ALT 0 - 44 U/L _3 RADIOGRAPHIC STUDIES: I have personally reviewed the radiological images as listed and agreed with the findings in the report. No results found.   ASSESSMENT & PLAN: Hayley Marks is a 71 y.o. female with     1. 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 S/p right mastectomy and SNLB -Given node negative disease, post-mastectomy radiation was not recommended.  -She completed anti-estrogen therapy with adjuvant letrozole in 09/2016 - 09/2021, Tolerated moderately well  -She reportedly had a left mammogram last month which was negative.  -Hayley Marks is clinically doing well.  Breast exam is benign, labs are normal.  No clinical concern for recurrence. -BP is elevated in clinic, she is under some stress. We discussed strategies to improve BP.  -She is over 5 years from definitive treatment, the recurrence risk has significantly decreased.  Continue surveillance -Follow-up in 1 year, or sooner if needed  2.  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 Genetics -Genetic results from 10/29/16 were  negative for known pathogenic mutations within any of 42 genes on the Invitae Common Hereditary Cancers Panel (Breast, Gyn, GI).  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     4. Osteoporosis -09/2018  DEXA showed osteoporosis with lowest T-Score of -2.5 at forearm radius. -Began Zometa 01/2021 every 6 months x4 doses, will receive last dose today  -Repeat DEXA as early as 11/2022 -Continue calcium, vitamin D, and weightbearing exercise   Plan: -Labs reviewed -R knee and L hip xrays today, I will call with results -Final zometa today -DEXA as early as 11/2022 -F/up in 1 year -Discussed BP management strategies  Orders Placed This Encounter  Procedures   DG Hip Unilat W or W/O Pelvis 1 View Left    Standing Status:   Future    Number of Occurrences:   1    Standing Expiration Date:   08/30/2023    Order Specific Question:   Reason for Exam (SYMPTOM  OR DIAGNOSIS REQUIRED)    Answer:   1 month left hip pain. h/o breast cancer    Order Specific Question:   Preferred imaging location?    Answer:   [facility]   DG Knee 1-2 Views Right    Standing Status:   Future    Number of Occurrences:   1    Standing Expiration Date:   08/31/2023    Order Specific Question:   Reason for Exam (SYMPTOM  OR DIAGNOSIS REQUIRED)    Answer:   1 month R knee pain. h/o breast cancer    Order Specific Question:   Preferred imaging location?    Answer:   [facility]   DG Bone Density    Standing Status:   Future    Standing Expiration Date:   08/30/2023    Scheduling Instructions:     [place]    Order Specific Question:   Reason for Exam (SYMPTOM  OR DIAGNOSIS REQUIRED)    Answer:   osteoporosis, completing 2 years zometa 08/2022, h/o breast cancer and AI 2017 - 2022    Order Specific Question:   Preferred imaging location?    Answer:   External   All questions were answered. The patient knows to call the clinic  with any problems, questions or concerns. No barriers to learning was detected. I spent 20 minutes counseling the patient face to face. The total time spent in the appointment was 30 minutes and more than 50% was on counseling and review of test results.     Alla Feeling, NP 08/30/22

## 2022-08-30 NOTE — Progress Notes (Signed)
Unable to get IV access for Zometa infusion today. Patient going to scheduling to re-schedule today's appointment.

## 2022-09-03 ENCOUNTER — Telehealth: Payer: Self-pay

## 2022-09-03 NOTE — Telephone Encounter (Signed)
error 

## 2022-09-03 NOTE — Telephone Encounter (Addendum)
Called pt and informed her of message form Lacie, NP (see below) pt states that she will follow up with her PCP. No further questions at this time.   ----- Message from Alla Feeling, NP sent at 09/03/2022  1:41 PM EDT ----- Please call pt to let her know xrays show degenerative changes in the hip and knee, no dislocation or fracture. I would recommend to follow up with PCP or I can refer her to orthopedics. Let me know which she prefers.  Thanks, Regan Rakers, NP

## 2022-09-07 ENCOUNTER — Telehealth: Payer: Self-pay | Admitting: Hematology

## 2022-09-07 NOTE — Telephone Encounter (Signed)
Left message with follow-up appointment per 9/28 los.

## 2022-09-18 ENCOUNTER — Inpatient Hospital Stay: Payer: Medicare Other

## 2022-09-18 ENCOUNTER — Telehealth: Payer: Self-pay | Admitting: Hematology

## 2022-09-18 ENCOUNTER — Other Ambulatory Visit: Payer: Self-pay

## 2022-09-18 ENCOUNTER — Other Ambulatory Visit: Payer: Self-pay | Admitting: Nurse Practitioner

## 2022-09-18 NOTE — Telephone Encounter (Signed)
Called per r/s per 10/17 in basket, pt said they do not want to r/s at this time and said they will call us back to r/s

## 2023-09-02 ENCOUNTER — Ambulatory Visit: Payer: Medicare Other | Admitting: Nurse Practitioner

## 2023-09-02 ENCOUNTER — Other Ambulatory Visit: Payer: Medicare Other
